# Patient Record
Sex: Female | Born: 1985 | Race: Black or African American | Hispanic: No | Marital: Single | Smoking: Current every day smoker
Health system: Southern US, Community
[De-identification: ages and names within clinical notes are randomized; demographics above are authoritative.]

## PROBLEM LIST (undated history)

## (undated) DIAGNOSIS — Z789 Other specified health status: Secondary | ICD-10-CM

## (undated) DIAGNOSIS — A749 Chlamydial infection, unspecified: Secondary | ICD-10-CM

## (undated) HISTORY — PX: NO PAST SURGERIES: SHX2092

## (undated) HISTORY — PX: OTHER SURGICAL HISTORY: SHX169

## (undated) HISTORY — PX: LIPOSUCTION: SHX10

## (undated) HISTORY — DX: Chlamydial infection, unspecified: A74.9

---
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
## 2006-01-11 DIAGNOSIS — A749 Chlamydial infection, unspecified: Secondary | ICD-10-CM

## 2006-01-11 HISTORY — DX: Chlamydial infection, unspecified: A74.9

## 2006-02-07 ENCOUNTER — Inpatient Hospital Stay (HOSPITAL_COMMUNITY): Admission: AD | Admit: 2006-02-07 | Discharge: 2006-02-07 | Payer: Self-pay | Admitting: Obstetrics & Gynecology

## 2006-04-06 ENCOUNTER — Encounter (INDEPENDENT_AMBULATORY_CARE_PROVIDER_SITE_OTHER): Payer: Self-pay | Admitting: *Deleted

## 2006-04-06 ENCOUNTER — Ambulatory Visit: Payer: Self-pay | Admitting: Obstetrics & Gynecology

## 2006-04-06 ENCOUNTER — Encounter (INDEPENDENT_AMBULATORY_CARE_PROVIDER_SITE_OTHER): Payer: Self-pay | Admitting: Obstetrics & Gynecology

## 2006-06-03 ENCOUNTER — Ambulatory Visit: Payer: Self-pay | Admitting: Family Medicine

## 2006-06-22 ENCOUNTER — Ambulatory Visit: Payer: Self-pay | Admitting: Obstetrics and Gynecology

## 2006-07-11 ENCOUNTER — Inpatient Hospital Stay (HOSPITAL_COMMUNITY): Admission: AD | Admit: 2006-07-11 | Discharge: 2006-07-12 | Payer: Self-pay | Admitting: Obstetrics & Gynecology

## 2006-07-12 ENCOUNTER — Inpatient Hospital Stay (HOSPITAL_COMMUNITY): Admission: AD | Admit: 2006-07-12 | Discharge: 2006-07-13 | Payer: Self-pay | Admitting: Family Medicine

## 2006-09-16 ENCOUNTER — Ambulatory Visit: Payer: Self-pay | Admitting: Obstetrics & Gynecology

## 2006-12-12 ENCOUNTER — Ambulatory Visit: Payer: Self-pay | Admitting: Obstetrics and Gynecology

## 2007-10-27 IMAGING — CT CT PELVIS W/ CM
2 of 5 series · 17 of 46 positions shown, 19 images · IV contrast (APPLIED)
Comparison: No prior CT studies for comparison.

CLINICAL DATA: Mid abdominal pain.  Diarrhea.  Elevated white blood cell count. 
 ABDOMEN CT WITH CONTRAST:
TECHNIQUE: Multidetector CT imaging of the abdomen was performed following the standard protocol during bolus administration of intravenous contrast.
 Contrast:  80 cc Omnipaque 300.  Oral contrast was also administered.
TECHNIQUE: Multidetector CT imaging of the pelvis was performed following the standard protocol during bolus administration of intravenous contrast.

[Series 2: abd/pelv with 5.0 b31f st · axial · 0.59mm/px · z∈[+878,+1253]mm · 14 of 85 slices shown, 16 images]
[im 5/85  soft-tissue]
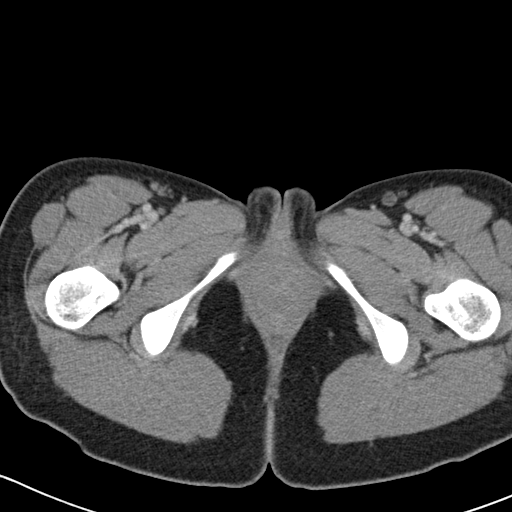
[im 5/85  bone]
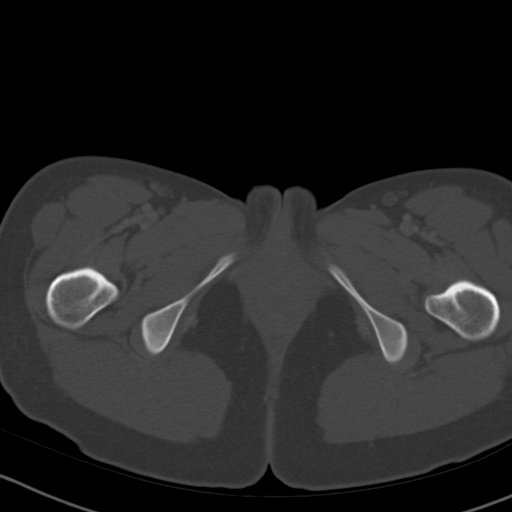
[im 13/85  soft-tissue]
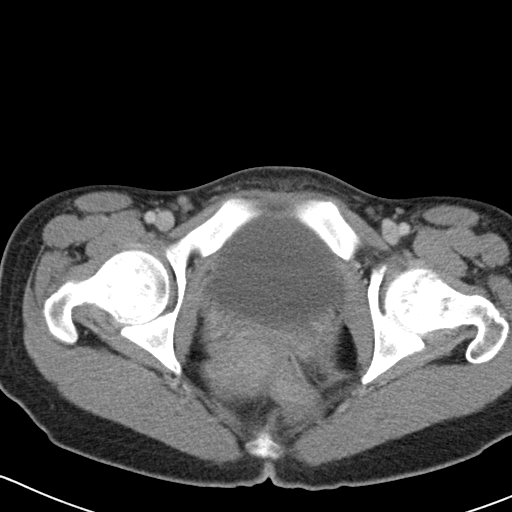
[im 17/85  soft-tissue]
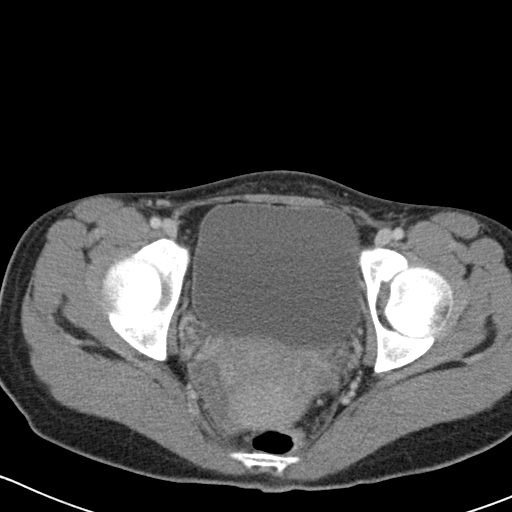
[im 22/85  soft-tissue]
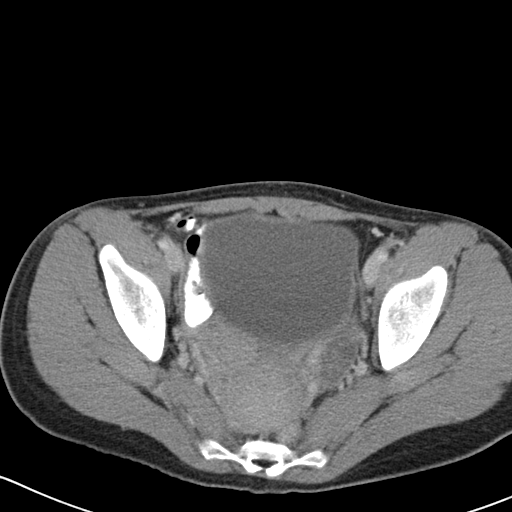
[im 30/85  soft-tissue]
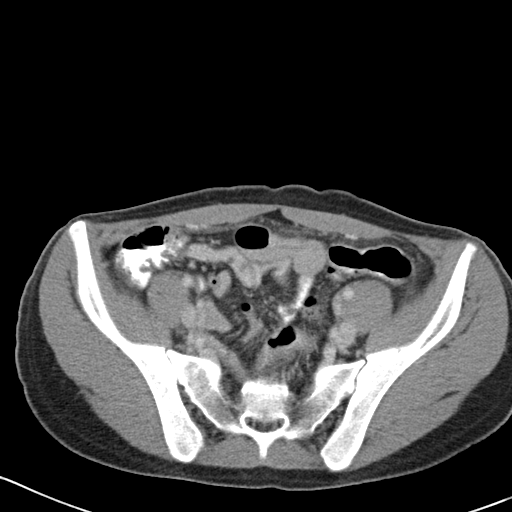
[im 34/85  soft-tissue]
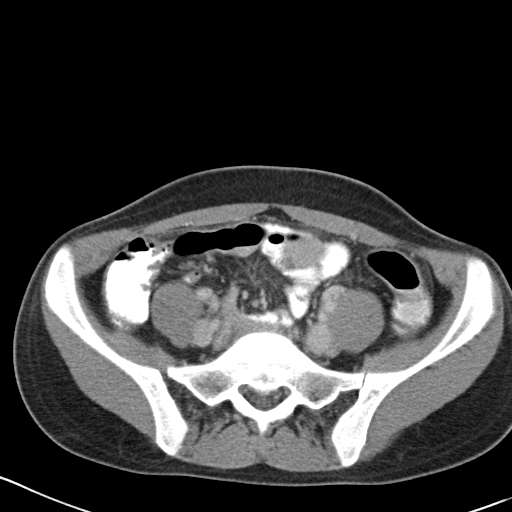
[im 38/85  soft-tissue]
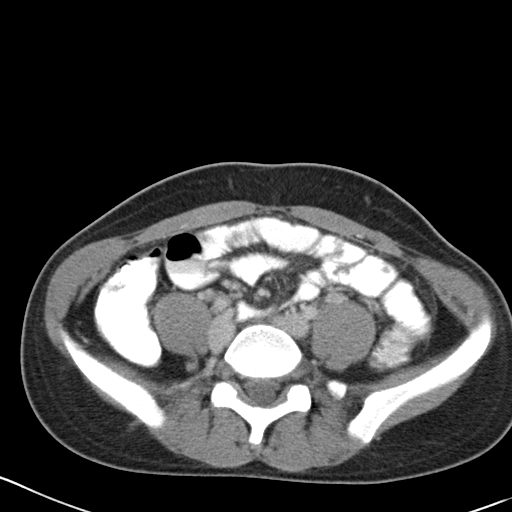
[im 47/85  soft-tissue]
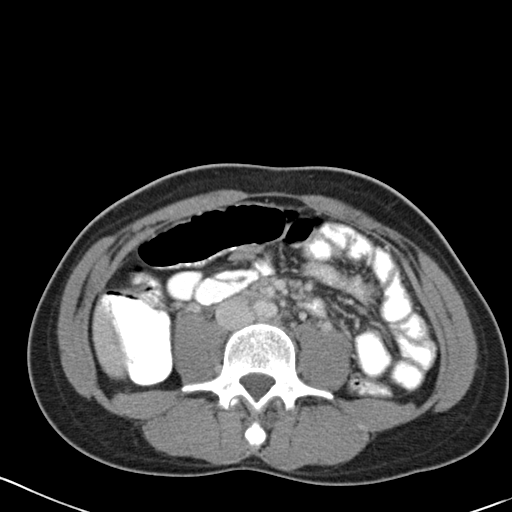
[im 51/85  soft-tissue]
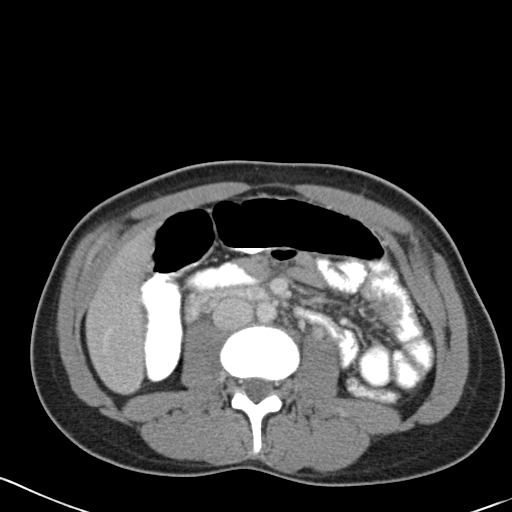
[im 51/85  bone]
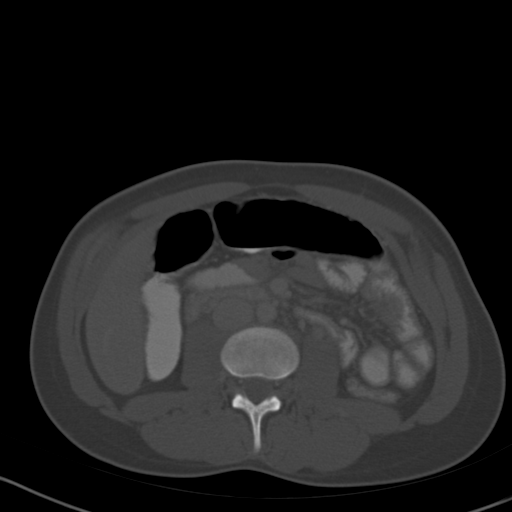
[im 55/85  soft-tissue]
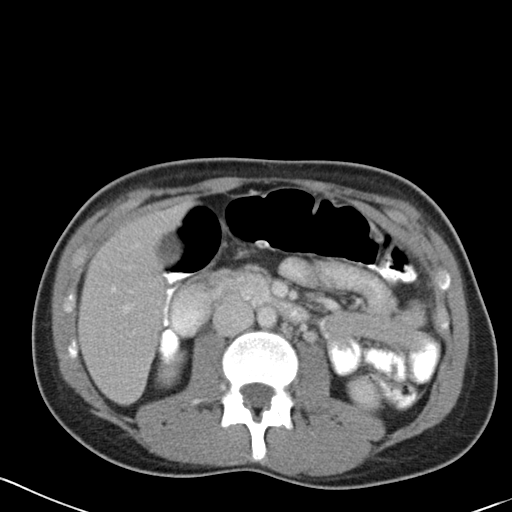
[im 64/85  soft-tissue]
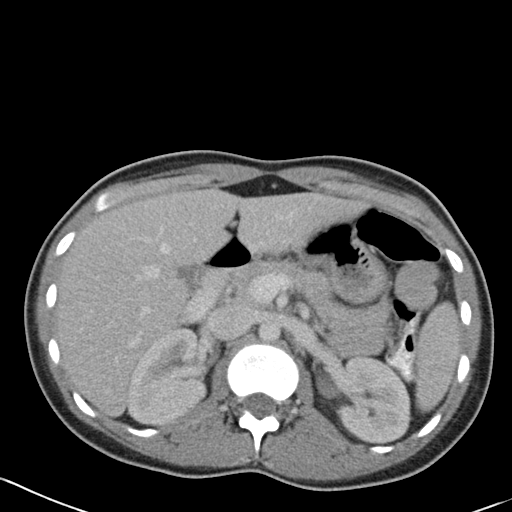
[im 68/85  soft-tissue]
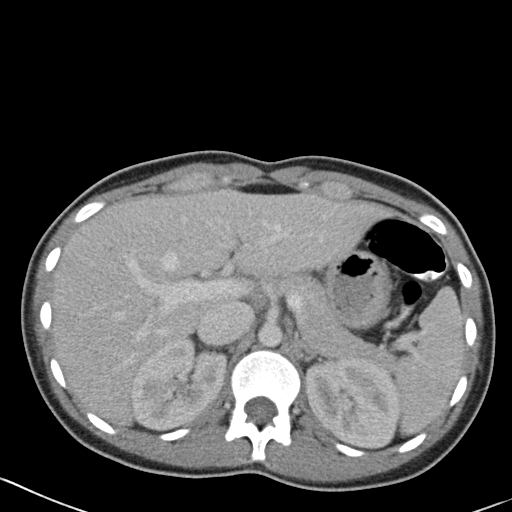
[im 72/85  soft-tissue]
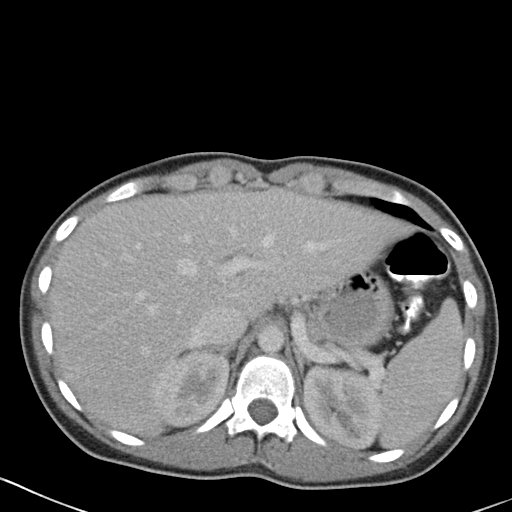
[im 80/85  soft-tissue]
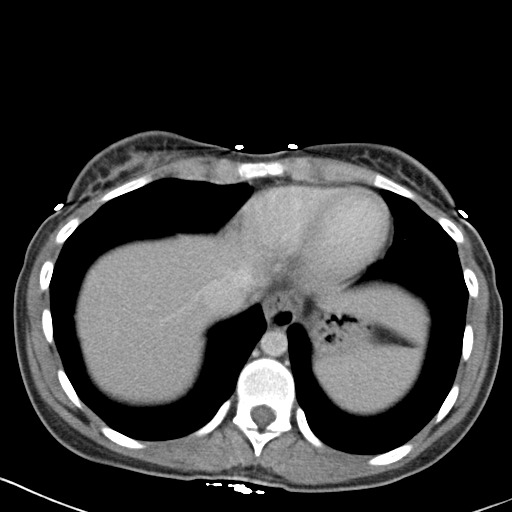

[Series 603: coronal · coronal · 0.82mm/px · 3 of 87 slices shown]
[im 29/87  soft-tissue]
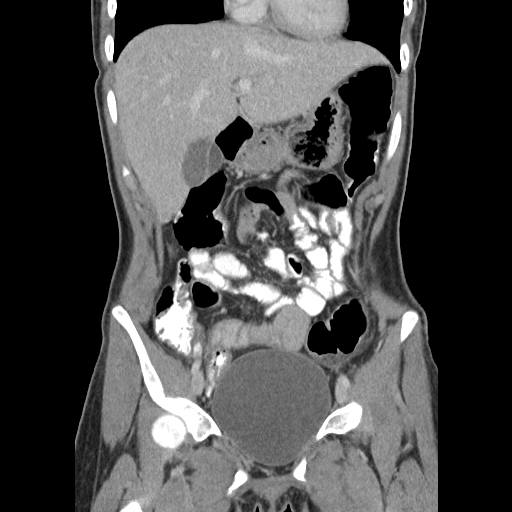
[im 39/87  soft-tissue]
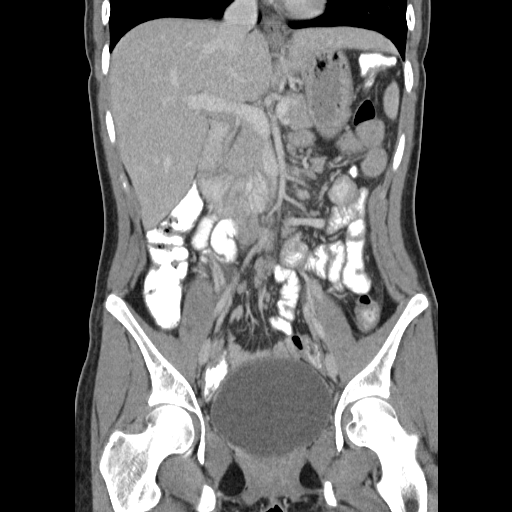
[im 48/87  soft-tissue]
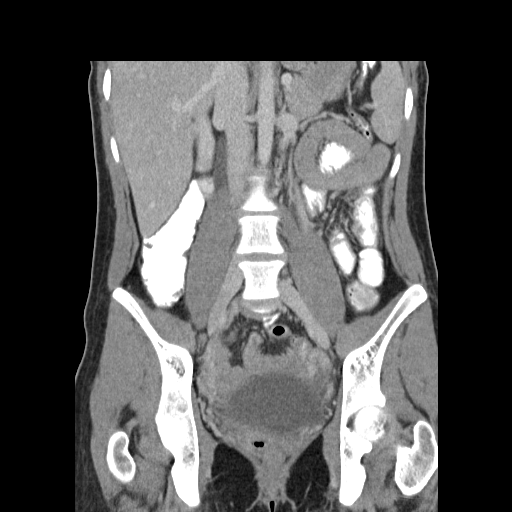

[17 of 46 positions shown; findings below may reference images not displayed]

FINDINGS: Visualized lung bases are unremarkable.  The contrast-enhanced appearance of the liver, spleen, pancreas, gallbladder, adrenal glands, and kidneys are within normal limits.  Delayed imaging demonstrates normal excretion of contrast from both kidneys in a symmetric fashion.  There is a small posterior left renal cyst present, which has a benign appearance.  Bowel loops have normal caliber without obstruction of inflammatory changes.  No free fluid or abscess is seen within the abdomen.  No evidence of adenopathy.  Bony structures are unremarkable.
IMPRESSION: Normal CT of the abdomen. 
 PELVIS CT WITH CONTRAST:
FINDINGS: The appendix is visualized in the right lower pelvis as a normal discrete structure without evidence of thickening or surrounding inflammation.  No acute findings.  The uterus and adnexal regions are grossly unremarkable by CT.  The bladder is moderately distended.  No hernias.
IMPRESSION: No acute findings in the pelvis.  Normal CT appearance of the appendix.

## 2007-11-27 ENCOUNTER — Inpatient Hospital Stay (HOSPITAL_COMMUNITY): Admission: AD | Admit: 2007-11-27 | Discharge: 2007-11-27 | Payer: Self-pay | Admitting: Family Medicine

## 2008-06-21 ENCOUNTER — Inpatient Hospital Stay (HOSPITAL_COMMUNITY): Admission: AD | Admit: 2008-06-21 | Discharge: 2008-06-23 | Payer: Self-pay | Admitting: Obstetrics

## 2009-04-07 ENCOUNTER — Inpatient Hospital Stay (HOSPITAL_COMMUNITY): Admission: AD | Admit: 2009-04-07 | Discharge: 2009-04-07 | Payer: Self-pay | Admitting: Family Medicine

## 2009-04-07 ENCOUNTER — Encounter: Payer: Self-pay | Admitting: Family Medicine

## 2009-04-07 ENCOUNTER — Ambulatory Visit: Payer: Self-pay | Admitting: Family Medicine

## 2009-04-09 ENCOUNTER — Ambulatory Visit: Payer: Self-pay | Admitting: Obstetrics and Gynecology

## 2009-04-09 LAB — CONVERTED CEMR LAB
Hgb A: 64.4 % — ABNORMAL LOW (ref 96.8–97.8)
Hgb F Quant: 0 % (ref 0.0–2.0)

## 2009-04-16 ENCOUNTER — Ambulatory Visit: Payer: Self-pay | Admitting: Obstetrics and Gynecology

## 2009-04-23 ENCOUNTER — Ambulatory Visit: Payer: Self-pay | Admitting: Obstetrics and Gynecology

## 2009-04-28 ENCOUNTER — Ambulatory Visit: Payer: Self-pay | Admitting: Obstetrics and Gynecology

## 2009-04-28 ENCOUNTER — Inpatient Hospital Stay (HOSPITAL_COMMUNITY): Admission: AD | Admit: 2009-04-28 | Discharge: 2009-04-30 | Payer: Self-pay | Admitting: Obstetrics & Gynecology

## 2009-09-03 ENCOUNTER — Inpatient Hospital Stay (HOSPITAL_COMMUNITY): Admission: AD | Admit: 2009-09-03 | Discharge: 2009-09-03 | Payer: Self-pay | Admitting: Obstetrics & Gynecology

## 2009-09-03 ENCOUNTER — Ambulatory Visit: Payer: Self-pay | Admitting: Gynecology

## 2009-09-12 ENCOUNTER — Inpatient Hospital Stay (HOSPITAL_COMMUNITY): Admission: AD | Admit: 2009-09-12 | Discharge: 2009-09-12 | Payer: Self-pay | Admitting: Obstetrics & Gynecology

## 2010-03-26 LAB — WET PREP, GENITAL: Trich, Wet Prep: NONE SEEN

## 2010-03-26 LAB — GC/CHLAMYDIA PROBE AMP, GENITAL: GC Probe Amp, Genital: NEGATIVE

## 2010-03-31 LAB — CBC
HCT: 28 % — ABNORMAL LOW (ref 36.0–46.0)
HCT: 30.1 % — ABNORMAL LOW (ref 36.0–46.0)
Hemoglobin: 9.1 g/dL — ABNORMAL LOW (ref 12.0–15.0)
Hemoglobin: 9.7 g/dL — ABNORMAL LOW (ref 12.0–15.0)
MCHC: 32.2 g/dL (ref 30.0–36.0)
MCHC: 32.5 g/dL (ref 30.0–36.0)
MCV: 72.3 fL — ABNORMAL LOW (ref 78.0–100.0)
MCV: 72.4 fL — ABNORMAL LOW (ref 78.0–100.0)
Platelets: 258 10*3/uL (ref 150–400)
Platelets: 259 10*3/uL (ref 150–400)
RBC: 3.88 MIL/uL (ref 3.87–5.11)
RBC: 4.16 MIL/uL (ref 3.87–5.11)
RDW: 16.3 % — ABNORMAL HIGH (ref 11.5–15.5)
RDW: 16.4 % — ABNORMAL HIGH (ref 11.5–15.5)
WBC: 9.3 10*3/uL (ref 4.0–10.5)
WBC: 9.6 10*3/uL (ref 4.0–10.5)

## 2010-03-31 LAB — T.PALLIDUM AB, IGG: T pallidum Antibodies (TP-PA): 0.07 S/CO (ref <0.90)

## 2010-03-31 LAB — RPR: RPR Ser Ql: REACTIVE — AB

## 2010-04-01 LAB — POCT URINALYSIS DIP (DEVICE)
Bilirubin Urine: NEGATIVE
Ketones, ur: NEGATIVE mg/dL
Leukocytes, UA: NEGATIVE
Nitrite: NEGATIVE
Protein, ur: NEGATIVE mg/dL
pH: 6 (ref 5.0–8.0)
pH: 6.5 (ref 5.0–8.0)

## 2010-04-05 LAB — TYPE AND SCREEN: ABO/RH(D): B POS

## 2010-04-05 LAB — RUBELLA SCREEN: Rubella: 83.8 IU/mL — ABNORMAL HIGH

## 2010-04-05 LAB — POCT URINALYSIS DIP (DEVICE)
Bilirubin Urine: NEGATIVE
Hgb urine dipstick: NEGATIVE
Ketones, ur: NEGATIVE mg/dL
Nitrite: NEGATIVE
Urobilinogen, UA: 0.2 mg/dL (ref 0.0–1.0)
pH: 7 (ref 5.0–8.0)

## 2010-04-05 LAB — URINALYSIS, ROUTINE W REFLEX MICROSCOPIC
Glucose, UA: NEGATIVE mg/dL
Protein, ur: NEGATIVE mg/dL
Specific Gravity, Urine: 1.01 (ref 1.005–1.030)
Urobilinogen, UA: 0.2 mg/dL (ref 0.0–1.0)
pH: 6.5 (ref 5.0–8.0)

## 2010-04-05 LAB — CBC
HCT: 27 % — ABNORMAL LOW (ref 36.0–46.0)
Hemoglobin: 8.7 g/dL — ABNORMAL LOW (ref 12.0–15.0)
MCV: 74.6 fL — ABNORMAL LOW (ref 78.0–100.0)
Platelets: 272 10*3/uL (ref 150–400)
RBC: 3.62 MIL/uL — ABNORMAL LOW (ref 3.87–5.11)
RDW: 15.6 % — ABNORMAL HIGH (ref 11.5–15.5)

## 2010-04-05 LAB — DIFFERENTIAL
Basophils Relative: 0 % (ref 0–1)
Eosinophils Relative: 0 % (ref 0–5)
Neutro Abs: 6.7 10*3/uL (ref 1.7–7.7)

## 2010-04-05 LAB — RAPID URINE DRUG SCREEN, HOSP PERFORMED: Amphetamines: NOT DETECTED

## 2010-04-05 LAB — WET PREP, GENITAL
Clue Cells Wet Prep HPF POC: NONE SEEN
Yeast Wet Prep HPF POC: NONE SEEN

## 2010-04-05 LAB — HEPATITIS B SURFACE ANTIGEN: Hepatitis B Surface Ag: NEGATIVE

## 2010-04-05 LAB — URINE CULTURE: Colony Count: NO GROWTH

## 2010-04-05 LAB — RPR: RPR Ser Ql: NONREACTIVE

## 2010-04-05 LAB — STREP B DNA PROBE

## 2010-04-05 LAB — RAPID HIV SCREEN (WH-MAU): Rapid HIV Screen: NONREACTIVE

## 2010-04-20 LAB — CBC
HCT: 24.7 % — ABNORMAL LOW (ref 36.0–46.0)
Hemoglobin: 10.5 g/dL — ABNORMAL LOW (ref 12.0–15.0)
Hemoglobin: 8.3 g/dL — ABNORMAL LOW (ref 12.0–15.0)
MCHC: 33.8 g/dL (ref 30.0–36.0)
MCV: 78.7 fL (ref 78.0–100.0)
RBC: 3.94 MIL/uL (ref 3.87–5.11)
WBC: 11.3 10*3/uL — ABNORMAL HIGH (ref 4.0–10.5)
WBC: 11.8 10*3/uL — ABNORMAL HIGH (ref 4.0–10.5)

## 2010-04-20 LAB — RPR: RPR Ser Ql: NONREACTIVE

## 2010-05-26 NOTE — H&P (Signed)
Amy [name], Amy [name]             ACCOUNT NO.:  [id number]   MEDICAL RECORD NO.:  [id number]          PATIENT TYPE:  INP   LOCATION:  9133                          FACILITY:  [facility]   PHYSICIAN:  Roseanna Rainbow, M.D.DATE OF BIRTH:  Jun 19, 1985   DATE OF ADMISSION:  06/21/2008  DATE OF DISCHARGE:                              HISTORY & PHYSICAL   CHIEF COMPLAINT:  The patient is a 25 year old para 3 at 40 plus 1 week,  complaining of uterine contractions.   HISTORY OF PRESENT ILLNESS:  Please see the above.   SOCIAL HISTORY:  She is single.  She denies any tobacco, ethanol, or  drug use.   ALLERGIES:  No known drug allergies.   PAST GYNECOLOGIC HISTORY:  History of Chlamydia.  Normal triad.   PAST OBSTETRICAL HISTORY:  In 2004, she was delivered of a live born  female, 6 pounds 8 ounces, full term vaginal delivery, no complications.  In 2007, she was delivered a live born female, 7 pounds 3 ounces, full  term, spontaneous vaginal delivery, no complications.   PAST MEDICAL HISTORY:  Urinary tract infection and pyelonephritis.   PAST SURGICAL HISTORY:  She denies.   FAMILY HISTORY:  Noncontributory.   PRENATAL LABS:  Hemoglobin 10.6, hematocrit 32.3, and platelets 304,000.  Blood type B positive, antibody screen negative, RPR nonreactive,  rubella immune, hepatitis C surface antigen negative, HIV nonreactive.  PPD negative.  Pap test negative.  GC probe negative.  Chlamydia probe  negative.  One-hour GTT 72.  GBS negative on May 28, 2008.   PHYSICAL EXAMINATION:  VITAL SIGNS:  Stable, afebrile.  Fetal heart  tracing reassuring.  Tocodynamometer, uterine contractions every 2-4  minutes.  ABDOMEN:  Gravid.  Sterile vaginal exam per the RN.  The cervix is 4 cm  dilated.   ASSESSMENT:  Multiparous patient at term, late latent versus early  active labor.  Fetal heart tracing consistent with fetal well-being.   PLAN:  Admission.  Expectant management.      Roseanna Rainbow, M.D.  Electronically Signed     LAJ/MEDQ  D:  06/21/2008  T:  06/21/2008  Job:  [id number]

## 2010-05-29 NOTE — Discharge Summary (Signed)
   NAME:  Amy [name], Amy [name]                       ACCOUNT NO.:  [id number]   MEDICAL RECORD NO.:  [id number]                   PATIENT TYPE:  INP   LOCATION:  9102                                 FACILITY:  [facility]   PHYSICIAN:  Georgina Peer, M.D.              DATE OF BIRTH:  1985-06-07   DATE OF ADMISSION:  01/06/2002  DATE OF DISCHARGE:  01/08/2002                                 DISCHARGE SUMMARY   ADMISSION DIAGNOSES:  1. Abdominal and flank pain.  2. Pregnancy 18 weeks.  3. Suspect pyelonephritis.  4. Positive Chlamydia untreated.   DISCHARGE DIAGNOSES:  1. Urinary tract infection responding to antibiotics.  2. Chlamydia, treated.  3. Abdominal pain, resolving.   HISTORY OF PRESENT ILLNESS:  The patient is a 25 year old gravida 1, para 0  with an EDC of Jun 11, 2002, a known E. coli UTI with medication called into  the pharmacy December 15, 2001, but not picked up for Macrodantin and also  Zithromax 1 g for Chlamydia.  The patient presented with lower abdominal and  right flank pain.  The result of the urine culture and sensitivities and  Chlamydia was unknown to the covering physician.  She also had a positive  antibody screen, anti-V with a titer of 1:1 on December 13, 2001.  The  patient is feeling better.  She was admitted and was started on IV  ampicillin.  The cultures from the office showed E. coli that was resistant  to ampicillin.  She was then switched to Macrobid.  She was afebrile.  WBC  count was 9.5, hemoglobin 9.9, platelet count 379,000, CMET normal.  IV  fluids rehydrated the patient.  Abdominal ultrasound revealed normal  abdomen, no kidney problems, a hypoechoic soft tissue density along the  anterior uterine wall measuring 8 x 3 x 2 consistent with an old  subchorionic hemorrhage.  Age compatible with 18 weeks and 1 day.  Placenta  was posterior.  Cervix was 4.4 and closed.  Female gender was noted.  With  the patient on Macrobid doing better, no  nausea/vomiting, able to take oral  medicine, the ultrasound results were discussed and the patient was treated  with 1 g of Zithromax during the hospital stay.  She was discharged home in  good condition to follow up with Macrobid 100 mg b.i.d. for seven days,  follow up in the office in two weeks.  Urine culture in four weeks.  Chlamydia test of cure in two weeks.                                               Georgina Peer, M.D.    JPN/MEDQ  D:  01/08/2002  T:  01/08/2002  Job:  [id number]

## 2010-05-29 NOTE — Group Therapy Note (Signed)
NAMEMI, BALLA NO.:  [id number]   MEDICAL RECORD NO.:  [id number]          PATIENT TYPE:  WOC   LOCATION:  [facility]                   FACILITY:  [facility]   PHYSICIAN:  Dorthula Perfect, MD     DATE OF BIRTH:  22-Dec-1985   DATE OF SERVICE:  04/06/2006                                  CLINIC NOTE   A 25 year old black female, gravida 2 para 2, last menstrual period  March 6 is here for annual exam, Pap smear, and Depo shot.  She has only  received the Depo Provera once and that was early last year at her  postpartum visit.  Since that time condoms have been used.  She has not  had intercourse in the past 3 weeks.  She complains also of a vaginal  odor and slight discharge.  She douches once a month with Summer's Eve.   PAST MEDICAL HISTORY:  No surgery or allergies.   FAMILY HISTORY:  Negative.   PHYSICAL EXAMINATION:  Height 5 foot 5, weight 126, blood pressure  120/70, thyroid exam is normal.  BREASTS:  Bilaterally normal.  ABDOMEN:  Flat, soft, and nontender.  No masses are felt.  PELVIC:  External genitalia and BUS glands are normal.  Vaginal vault  was epithelized.  There was a white, watery vaginal discharge present.  Cervix was epithelized.  BIMANUAL EXAMINATION:  The uterus is in the midline and is of normal  size and shape.  Adnexa and structures are normal.   Wet prep was performed.  It did show presence of clue cells compatible  with bacterial vaginosis.   DIAGNOSIS:  1. Bacterial vaginosis.  2. Contraception - Depo Provera.   DISPOSITION:  1. Wet prep.  2. Thin Prep Pap smear with, at the patient's request, probe for GC      and chlamydia.  3. Depo Provera 150 mg every 3 months.           ______________________________  Dorthula Perfect, MD     ER/MEDQ  D:  04/06/2006  T:  04/06/2006  Job:  [id number]

## 2010-08-04 ENCOUNTER — Encounter (HOSPITAL_COMMUNITY): Payer: Self-pay | Admitting: *Deleted

## 2010-08-04 ENCOUNTER — Inpatient Hospital Stay (HOSPITAL_COMMUNITY)
Admission: AD | Admit: 2010-08-04 | Discharge: 2010-08-04 | Disposition: A | Discharge status: Home / Self Care | Payer: Self-pay | Source: Ambulatory Visit | Attending: Obstetrics & Gynecology | Admitting: Obstetrics & Gynecology

## 2010-08-04 DIAGNOSIS — M545 Low back pain, unspecified: Secondary | ICD-10-CM | POA: Insufficient documentation

## 2010-08-04 DIAGNOSIS — R110 Nausea: Secondary | ICD-10-CM | POA: Insufficient documentation

## 2010-08-04 DIAGNOSIS — F17200 Nicotine dependence, unspecified, uncomplicated: Secondary | ICD-10-CM | POA: Insufficient documentation

## 2010-08-04 DIAGNOSIS — R350 Frequency of micturition: Secondary | ICD-10-CM | POA: Insufficient documentation

## 2010-08-04 DIAGNOSIS — N390 Urinary tract infection, site not specified: Secondary | ICD-10-CM | POA: Insufficient documentation

## 2010-08-04 DIAGNOSIS — R300 Dysuria: Secondary | ICD-10-CM | POA: Diagnosis present

## 2010-08-04 LAB — URINALYSIS, ROUTINE W REFLEX MICROSCOPIC
Bilirubin Urine: NEGATIVE
Glucose, UA: >1000 mg/dL — AB
Ketones, ur: NEGATIVE mg/dL
Specific Gravity, Urine: 1.015 (ref 1.005–1.030)
pH: 6 (ref 5.0–8.0)

## 2010-08-04 LAB — URINE MICROSCOPIC-ADD ON

## 2010-08-04 LAB — POCT PREGNANCY, URINE: Preg Test, Ur: NEGATIVE

## 2010-08-04 MED ORDER — SULFAMETHOXAZOLE-TRIMETHOPRIM 800-160 MG PO TABS: 1.0000 | ORAL_TABLET | Freq: Two times a day (BID) | ORAL | Status: AC

## 2010-08-04 MED ORDER — ONDANSETRON HCL 4 MG PO TABS: 4.0000 mg | ORAL_TABLET | Freq: Three times a day (TID) | ORAL | Status: AC | PRN

## 2010-08-04 MED ORDER — PHENAZOPYRIDINE HCL 200 MG PO TABS: 200.0000 mg | ORAL_TABLET | Freq: Three times a day (TID) | ORAL | Status: AC | PRN

## 2010-08-04 NOTE — Progress Notes (Signed)
Pt states she has been having pressure in both sides for the past week with headache.Pt states she has also been nauseous and is having dizzy spells as well

## 2010-08-04 NOTE — ED Provider Notes (Signed)
History   Pt reports dysuria, frequency, bilat LBP x 1 week and new-onset nausea. She denies flank pain, fever, chills.  Chief Complaint  Patient presents with  . Abdominal Pain    pain present x 1 week   HPI  Past Medical History  Diagnosis Date  . No pertinent past medical history     Past Surgical History  Procedure Date  . No past surgeries     No family history on file.  History  Substance Use Topics  . Smoking status: Current Everyday Smoker -- 0.5 packs/day  . Smokeless tobacco: Never Used  . Alcohol Use: No    OB History    Grav Para Term Preterm Abortions TAB SAB Ect Mult Living   13 8 4 4 5 1 4  0 0 1      Review of Systems  Physical Exam  BP 109/69  Pulse 99  Temp(Src) 99.4 F (37.4 C) (Oral)  Resp 20  Ht 6\' 6"  (1.981 m)  Wt 67.246 kg (148 lb 4 oz)  BMI 17.13 kg/m2  LMP 07/13/2010  Physical Exam No CVAT Results for orders placed during the hospital encounter of 08/04/10 (from the past 24 hour(s))  URINALYSIS, ROUTINE W REFLEX MICROSCOPIC     Status: Abnormal   Collection Time   08/04/10  8:25 PM      Component Value Range   Color, Urine YELLOW  YELLOW    Appearance CLEAR  CLEAR    Specific Gravity, Urine 1.015  1.005 - 1.030    pH 6.0  5.0 - 8.0    Glucose, UA >1000 (*) NEGATIVE (mg/dL)   Hgb urine dipstick TRACE (*) NEGATIVE    Bilirubin Urine NEGATIVE  NEGATIVE    Ketones, ur NEGATIVE  NEGATIVE (mg/dL)   Protein, ur NEGATIVE  NEGATIVE (mg/dL)   Urobilinogen, UA 0.2  0.0 - 1.0 (mg/dL)   Nitrite POSITIVE (*) NEGATIVE    Leukocytes, UA MODERATE (*) NEGATIVE   URINE MICROSCOPIC-ADD ON     Status: Abnormal   Collection Time   08/04/10  8:25 PM      Component Value Range   Squamous Epithelial / LPF FEW (*) RARE    WBC, UA TOO NUMEROUS TO COUNT  <3 (WBC/hpf)   Bacteria, UA FEW (*) RARE   POCT PREGNANCY, URINE     Status: Normal   Collection Time   08/04/10  9:18 PM      Component Value Range   Preg Test, Ur NEGATIVE       ED Course    Procedures Assessment: 1. UTI  Plan 1. D/C home 2. Rx Bactrim DS PO BID x 3 days 3. Rx; Pyridium and Zofran 4. Push PO fluids

## 2010-08-06 LAB — URINE CULTURE: Culture  Setup Time: 201207250405

## 2010-08-11 ENCOUNTER — Inpatient Hospital Stay (INDEPENDENT_AMBULATORY_CARE_PROVIDER_SITE_OTHER)
Admission: RE | Admit: 2010-08-11 | Discharge: 2010-08-11 | Disposition: A | Discharge status: Home / Self Care | Payer: Self-pay | Source: Ambulatory Visit | Attending: Emergency Medicine | Admitting: Emergency Medicine

## 2010-08-11 DIAGNOSIS — N390 Urinary tract infection, site not specified: Secondary | ICD-10-CM

## 2010-08-11 DIAGNOSIS — B373 Candidiasis of vulva and vagina: Secondary | ICD-10-CM

## 2010-08-11 DIAGNOSIS — Z331 Pregnant state, incidental: Secondary | ICD-10-CM

## 2010-08-11 LAB — WET PREP, GENITAL
Clue Cells Wet Prep HPF POC: NONE SEEN
Trich, Wet Prep: NONE SEEN

## 2010-08-11 LAB — POCT URINALYSIS DIP (DEVICE)
Ketones, ur: 15 mg/dL — AB
Leukocytes, UA: NEGATIVE
Specific Gravity, Urine: 1.02 (ref 1.005–1.030)
pH: 7 (ref 5.0–8.0)

## 2010-08-12 LAB — GC/CHLAMYDIA PROBE AMP, GENITAL: Chlamydia, DNA Probe: NEGATIVE

## 2010-10-13 LAB — WET PREP, GENITAL
Trich, Wet Prep: NONE SEEN
Yeast Wet Prep HPF POC: NONE SEEN

## 2010-10-19 LAB — POCT URINALYSIS DIP (DEVICE)
Glucose, UA: NEGATIVE
Ketones, ur: NEGATIVE
Operator id: 194561
Protein, ur: NEGATIVE
Urobilinogen, UA: 0.2

## 2010-10-27 LAB — URINALYSIS, ROUTINE W REFLEX MICROSCOPIC
Ketones, ur: NEGATIVE
Nitrite: NEGATIVE
Protein, ur: NEGATIVE

## 2011-02-12 ENCOUNTER — Emergency Department (HOSPITAL_COMMUNITY): Admission: EM | Admit: 2011-02-12 | Discharge: 2011-02-12 | Discharge status: Against Medical Advice | Payer: Self-pay

## 2011-06-03 ENCOUNTER — Encounter: Payer: Self-pay | Admitting: Family Medicine

## 2011-06-03 ENCOUNTER — Ambulatory Visit (INDEPENDENT_AMBULATORY_CARE_PROVIDER_SITE_OTHER): Payer: Medicaid Other | Admitting: Family Medicine

## 2011-06-03 ENCOUNTER — Other Ambulatory Visit (HOSPITAL_COMMUNITY)
Admission: RE | Admit: 2011-06-03 | Discharge: 2011-06-03 | Disposition: A | Discharge status: Home / Self Care | Payer: Medicaid Other | Source: Ambulatory Visit | Attending: Family Medicine | Admitting: Family Medicine

## 2011-06-03 VITALS — BP 116/71 | HR 59 | Temp 98.4°F | Ht 66.5 in | Wt 159.0 lb

## 2011-06-03 DIAGNOSIS — Z3201 Encounter for pregnancy test, result positive: Secondary | ICD-10-CM

## 2011-06-03 DIAGNOSIS — F17200 Nicotine dependence, unspecified, uncomplicated: Secondary | ICD-10-CM | POA: Insufficient documentation

## 2011-06-03 DIAGNOSIS — Z113 Encounter for screening for infections with a predominantly sexual mode of transmission: Secondary | ICD-10-CM | POA: Insufficient documentation

## 2011-06-03 DIAGNOSIS — IMO0001 Reserved for inherently not codable concepts without codable children: Secondary | ICD-10-CM

## 2011-06-03 DIAGNOSIS — N912 Amenorrhea, unspecified: Secondary | ICD-10-CM

## 2011-06-03 DIAGNOSIS — N898 Other specified noninflammatory disorders of vagina: Secondary | ICD-10-CM | POA: Insufficient documentation

## 2011-06-03 DIAGNOSIS — Z309 Encounter for contraceptive management, unspecified: Secondary | ICD-10-CM

## 2011-06-03 DIAGNOSIS — Z3009 Encounter for other general counseling and advice on contraception: Secondary | ICD-10-CM | POA: Insufficient documentation

## 2011-06-03 DIAGNOSIS — R635 Abnormal weight gain: Secondary | ICD-10-CM | POA: Insufficient documentation

## 2011-06-03 LAB — POCT WET PREP (WET MOUNT)

## 2011-06-03 LAB — COMPREHENSIVE METABOLIC PANEL
ALT: 14 U/L (ref 0–35)
BUN: 8 mg/dL (ref 6–23)
CO2: 24 mEq/L (ref 19–32)
Calcium: 9 mg/dL (ref 8.4–10.5)
Chloride: 106 mEq/L (ref 96–112)
Creat: 0.66 mg/dL (ref 0.50–1.10)

## 2011-06-03 LAB — LIPID PANEL
Cholesterol: 154 mg/dL (ref 0–200)
Triglycerides: 67 mg/dL (ref <150)

## 2011-06-03 MED ORDER — METRONIDAZOLE 500 MG PO TABS: 500.0000 mg | ORAL_TABLET | Freq: Two times a day (BID) | ORAL | Status: AC

## 2011-06-03 NOTE — Assessment & Plan Note (Signed)
Currently pregnant. Desires termination. Desire tubal ligation. Gyn referral placed for consultation.

## 2011-06-03 NOTE — Assessment & Plan Note (Signed)
Wet prep positive for clue cells. Will treat for BV with flagyl 500 mg BID x7 days. F/u GC/Chlam.

## 2011-06-03 NOTE — Assessment & Plan Note (Signed)
Weight gain. Non obese. Check lipids and fasting CMET.

## 2011-06-03 NOTE — Assessment & Plan Note (Signed)
Patient desires termination of pregnancy and subsequent permament sterilization. She knows a reputable clinic for termination.

## 2011-06-03 NOTE — Assessment & Plan Note (Signed)
Advised cessation. Patient is contemplative. Will f/u.

## 2011-06-03 NOTE — Progress Notes (Signed)
Patient ID: Amy [name], female   DOB: 01-31-1985, 26 y.o.   MRN: [id number] Subjective:     Amy [name] is a 26 y.o. female new patient here to establish primary care and for a comprehensive physical exam.   The patient reports: 1. positive home urine pregnancy test 3 days ago: LMP 05/01/11. Reports amenorrhea, increased thirst and hunger and increased urinary frequency. Sexually active with one partner. Uses condoms sometimes.   2. Vaginal discharge: x 3 months. No itching, swelling, irritation or lesions.   Past Medical History  Diagnosis Date  . Chlamydia 2008   OB History    Grav Para Term Preterm Abortions TAB SAB Ect Mult Living   8 4 4  0 3 3 0 0 0 4     History   Social History  . Marital Status: Single    Spouse Name: N/A    Number of Children: N/A  . Years of Education: 10   Occupational History  . [employer]   Social History Main Topics  . Smoking status: Current Everyday Smoker -- 0.2 packs/day for 2 years    Types: Cigarettes  . Smokeless tobacco: Never Used   Comment: 2 cigs a day  . Alcohol Use: No  . Drug Use: No  . Sexually Active: Yes   Other Topics Concern  . Not on file   Social History Narrative  . No narrative on file   Health Maintenance  Topic Date Due  . Tetanus/tdap  09/04/2005  . Influenza Vaccine  10/12/2011  . Pap Smear  04/09/2012   Review of Systems A comprehensive review of systems was negative except as noted in HPI.   Objective:    BP 116/71  Pulse 59  Temp(Src) 98.4 F (36.9 C) (Oral)  Ht 5' 6.5" (1.689 m)  Wt 159 lb (72.122 kg)  BMI 25.28 kg/m2  LMP 05/04/2011  Breastfeeding? Unknown General appearance: alert, cooperative and no distress Eyes: conjunctivae/corneas clear. PERRL, EOM's intact. Fundi benign. Neck: no adenopathy, no carotid bruit, no JVD, supple, symmetrical, trachea midline and thyroid not enlarged, symmetric, no tenderness/mass/nodules Lungs: clear to auscultation bilaterally Heart: regular  rate and rhythm, S1, S2 normal, no murmur, click, rub or gallop Abdomen: soft, non-tender; bowel sounds normal; no masses,  no organomegaly Pelvic: cervix normal in appearance, external genitalia normal, no adnexal masses or tenderness, no cervical motion tenderness, rectovaginal septum normal, uterus normal size, shape, and consistency and vagina with scant white discharge Extremities: extremities normal, atraumatic, no cyanosis or edema Skin: Skin color, texture, turgor normal. No rashes or lesions Neurologic: Grossly normal    Assessment and Plan    Healthy female exam. See problem list for assessment and plan.

## 2011-06-03 NOTE — Patient Instructions (Signed)
Amy [name],  Thank you for coming in to see me today. It was very nice to meet you.   Based on your last period you are 4 weeks and 6 days pregnant today.  If you have decided to terminate the pregnancy please do so early. If you change your mind about terminating, please call the clinic for prenatal labs and start taking prenatal vitamins.   I have placed a referral to Gyn, you will be contacted about the appointment details.   I will call or send a letter with your lab and culture results.   Do your best to decrease and quit smoking it is the best thing for your health and your family's.   Dr. Armen Pickup

## 2011-06-09 ENCOUNTER — Telehealth: Payer: Self-pay | Admitting: Family Medicine

## 2011-06-09 ENCOUNTER — Encounter: Payer: Self-pay | Admitting: Family Medicine

## 2011-06-09 NOTE — Telephone Encounter (Signed)
Attempted to call patient at home regarding her normal labs and negative GC/Chlam. I did not leave a VM as I did not recognize the name on the outgoing message. I will send a letter instead.

## 2011-06-10 ENCOUNTER — Encounter: Payer: Self-pay | Admitting: Obstetrics & Gynecology

## 2011-07-12 ENCOUNTER — Ambulatory Visit (INDEPENDENT_AMBULATORY_CARE_PROVIDER_SITE_OTHER): Payer: Medicaid Other | Admitting: Obstetrics & Gynecology

## 2011-07-12 ENCOUNTER — Encounter: Payer: Self-pay | Admitting: Obstetrics & Gynecology

## 2011-07-12 VITALS — BP 116/64 | HR 85 | Temp 98.0°F | Resp 12 | Ht 67.0 in | Wt 164.7 lb

## 2011-07-12 DIAGNOSIS — Z3490 Encounter for supervision of normal pregnancy, unspecified, unspecified trimester: Secondary | ICD-10-CM

## 2011-07-12 DIAGNOSIS — Z01812 Encounter for preprocedural laboratory examination: Secondary | ICD-10-CM

## 2011-07-12 DIAGNOSIS — Z331 Pregnant state, incidental: Secondary | ICD-10-CM

## 2011-07-12 DIAGNOSIS — Z309 Encounter for contraceptive management, unspecified: Secondary | ICD-10-CM

## 2011-07-12 NOTE — Patient Instructions (Signed)
Contraception Choices Contraception (birth control) is the use of any methods or devices to prevent pregnancy. Below are some methods to help avoid pregnancy. HORMONAL METHODS   Contraceptive implant. This is a thin, plastic tube containing progesterone hormone. It does not contain estrogen hormone. Your caregiver inserts the tube in the inner part of the upper arm. The tube can remain in place for up to 3 years. After 3 years, the implant must be removed. The implant prevents the ovaries from releasing an egg (ovulation), thickens the cervical mucus which prevents sperm from entering the uterus, and thins the lining of the inside of the uterus.   Progesterone-only injections. These injections are given every 3 months by your caregiver to prevent pregnancy. This synthetic progesterone hormone stops the ovaries from releasing eggs. It also thickens cervical mucus and changes the uterine lining. This makes it harder for sperm to survive in the uterus.   Birth control pills. These pills contain estrogen and progesterone hormone. They work by stopping the egg from forming in the ovary (ovulation). Birth control pills are prescribed by a caregiver.Birth control pills can also be used to treat heavy periods.   Minipill. This type of birth control pill contains only the progesterone hormone. They are taken every day of each month and must be prescribed by your caregiver.   Birth control patch. The patch contains hormones similar to those in birth control pills. It must be changed once a week and is prescribed by a caregiver.   Vaginal ring. The ring contains hormones similar to those in birth control pills. It is left in the vagina for 3 weeks, removed for 1 week, and then a new one is put back in place. The patient must be comfortable inserting and removing the ring from the vagina.A caregiver's prescription is necessary.   Emergency contraception. Emergency contraceptives prevent pregnancy after  unprotected sexual intercourse. This pill can be taken right after sex or up to 5 days after unprotected sex. It is most effective the sooner you take the pills after having sexual intercourse. Emergency contraceptive pills are available without a prescription. Check with your pharmacist. Do not use emergency contraception as your only form of birth control.  BARRIER METHODS   Female condom. This is a thin sheath (latex or rubber) that is worn over the penis during sexual intercourse. It can be used with spermicide to increase effectiveness.   Female condom. This is a soft, loose-fitting sheath that is put into the vagina before sexual intercourse.   Diaphragm. This is a soft, latex, dome-shaped barrier that must be fitted by a caregiver. It is inserted into the vagina, along with a spermicidal jelly. It is inserted before intercourse. The diaphragm should be left in the vagina for 6 to 8 hours after intercourse.   Cervical cap. This is a round, soft, latex or plastic cup that fits over the cervix and must be fitted by a caregiver. The cap can be left in place for up to 48 hours after intercourse.   Sponge. This is a soft, circular piece of polyurethane foam. The sponge has spermicide in it. It is inserted into the vagina after wetting it and before sexual intercourse.   Spermicides. These are chemicals that kill or block sperm from entering the cervix and uterus. They come in the form of creams, jellies, suppositories, foam, or tablets. They do not require a prescription. They are inserted into the vagina with an applicator before having sexual intercourse. The process must be   repeated every time you have sexual intercourse.  INTRAUTERINE CONTRACEPTION  Intrauterine device (IUD). This is a T-shaped device that is put in a woman's uterus during a menstrual period to prevent pregnancy. There are 2 types:   Copper IUD. This type of IUD is wrapped in copper wire and is placed inside the uterus. Copper  makes the uterus and fallopian tubes produce a fluid that kills sperm. It can stay in place for 10 years.   Hormone IUD. This type of IUD contains the hormone progestin (synthetic progesterone). The hormone thickens the cervical mucus and prevents sperm from entering the uterus, and it also thins the uterine lining to prevent implantation of a fertilized egg. The hormone can weaken or kill the sperm that get into the uterus. It can stay in place for 5 years.  PERMANENT METHODS OF CONTRACEPTION  Female tubal ligation. This is when the woman's fallopian tubes are surgically sealed, tied, or blocked to prevent the egg from traveling to the uterus.   Female sterilization. This is when the female has the tubes that carry sperm tied off (vasectomy).This blocks sperm from entering the vagina during sexual intercourse. After the procedure, the man can still ejaculate fluid (semen).  NATURAL PLANNING METHODS  Natural family planning. This is not having sexual intercourse or using a barrier method (condom, diaphragm, cervical cap) on days the woman could become pregnant.   Calendar method. This is keeping track of the length of each menstrual cycle and identifying when you are fertile.   Ovulation method. This is avoiding sexual intercourse during ovulation.   Symptothermal method. This is avoiding sexual intercourse during ovulation, using a thermometer and ovulation symptoms.   Post-ovulation method. This is timing sexual intercourse after you have ovulated.  Regardless of which type or method of contraception you choose, it is important that you use condoms to protect against the transmission of sexually transmitted diseases (STDs). Talk with your caregiver about which form of contraception is most appropriate for you. Document Released: 12/28/2004 Document Revised: 12/17/2010 Document Reviewed: 05/06/2010 [employer] Patient Information 2012 [employer]. 

## 2011-07-12 NOTE — Progress Notes (Signed)
  Subjective:    Patient ID: Amy [name], female    DOB: 03-01-1985, 26 y.o.   MRN: [id number]  HPI [id number] Patient's last menstrual period was 05/04/2011. She is referred today by Dr. Dorena Cookey. In the last year she has had 4 pregnancy terminations. The last [place] was [place] 3 [place] ago [place]. At the time of the procedure she received Depo-Provera. She would like to discuss contraception options including possible sterilization. She's not entirely certain that she would like to have sterilization and she would consider other options. Past Medical History  Diagnosis Date  . Chlamydia 2008   Past Surgical History  Procedure Date  . No past surgeries   4 elective pregnancy terminations No Known Allergies Current Outpatient Prescriptions on File Prior to Visit  Medication Sig Dispense Refill  . medroxyPROGESTERone (DEPO-PROVERA) 150 MG/ML injection        History   Social History  . Marital Status: Single    Spouse Name: N/A    Number of Children: N/A  . Years of Education: 10   Occupational History  . [employer]   Social History Main Topics  . Smoking status: Former Smoker -- 0.2 packs/day for 2 years    Types: Cigarettes    Quit date: 05/12/2011  . Smokeless tobacco: Never Used   Comment: 2 cigs a day  . Alcohol Use: No  . Drug Use: No  . Sexually Active: Yes   Other Topics Concern  . Not on file   Social History Narrative  . No narrative on file      Review of Systems No vaginal bleeding or pain. No abnormal discharge.    Objective:   Physical Exam  Vitals reviewed. Constitutional: She appears well-developed and well-nourished. No distress.  Psychiatric: She has a normal mood and affect. Her behavior is normal.    Filed Vitals:   07/12/11 1501  BP: 116/64  Pulse: 85  Temp: 98 F (36.7 C)  TempSrc: Oral  Resp: 12  Height: 5\' 7"  (1.702 m)  Weight: 164 lb 11.2 oz (74.707 kg)   Positive urine pregnancy test today        Assessment & Plan:  3 weeks after a termination of pregnancy the patient has a positive pregnancy test. She received Depo-Provera at the time of the termination. She says that she had intercourse about a week and a half after the procedure. A quantitative beta hCG will be performed today and she'll be notified of the result. She may need to have this repeated. In the future we'll discuss her options for contraception including intrauterine device or subdermal implant.  Kaleeya Hancock 07/12/2011 3:41 PM

## 2011-07-13 LAB — HCG, QUANTITATIVE, PREGNANCY: hCG, Beta Chain, Quant, S: 1288.2 m[IU]/mL

## 2011-07-14 ENCOUNTER — Telehealth: Payer: Self-pay

## 2011-07-14 NOTE — Telephone Encounter (Signed)
Called pt and asked pt if she could please come in today to get another beta lab draw.  I explained to the pt that the providers would like to monitor her levels so that they get to < 2.  Pt stated understanding and stated that she would be here today @ 3pm for lab draw. Will notify the front desk of appt.

## 2011-07-14 NOTE — Telephone Encounter (Signed)
Message copied by Faythe Casa on Wed Jul 14, 2011  8:22 AM ------      Message from: Adam Phenix      Created: Tue Jul 13, 2011  2:27 PM       Repeat hcg 7/3

## 2011-08-02 ENCOUNTER — Inpatient Hospital Stay (HOSPITAL_COMMUNITY)
Admission: AD | Admit: 2011-08-02 | Discharge: 2011-08-02 | Disposition: A | Discharge status: Home / Self Care | Payer: Medicaid Other | Source: Ambulatory Visit | Attending: Obstetrics & Gynecology | Admitting: Obstetrics & Gynecology

## 2011-08-02 ENCOUNTER — Encounter (HOSPITAL_COMMUNITY): Payer: Self-pay | Admitting: Advanced Practice Midwife

## 2011-08-02 ENCOUNTER — Telehealth: Payer: Self-pay | Admitting: *Deleted

## 2011-08-02 DIAGNOSIS — O039 Complete or unspecified spontaneous abortion without complication: Secondary | ICD-10-CM | POA: Insufficient documentation

## 2011-08-02 LAB — HCG, QUANTITATIVE, PREGNANCY: hCG, Beta Chain, Quant, S: 47 m[IU]/mL — ABNORMAL HIGH (ref <5)

## 2011-08-02 NOTE — MAU Provider Note (Signed)
  History     CSN: [id number]  Arrival date and time: 08/02/11 1723   None     Chief Complaint  Patient presents with  . Vaginal Bleeding   HPI This is a 26 y.o. female at early stage of pregnancy (unknown) who presents for followup quant. HCG.  She was seen by Dr Debroah Loop s/p a termination and found to be pregnant 18 days after her abortion.  She has had some bleeding since then, though she states it was not heavy. Has also had some cramping.   RN Note: Pt states had TAB 06/24/2011, did receive depo injection same day. Spoke to her MD re: plans for tubal ligation, went to office, had +upt, then had bhcg level drawn, which was 1288 on 07/12/2011. Did have intercourse 1.5 weeks after surgery. Does note blood post intercourse. Does have cramping with intercourse as well.   OB History    Grav Para Term Preterm Abortions TAB SAB Ect Mult Living   8 4 4  0 4 4 0 0 0 4      Past Medical History  Diagnosis Date  . Chlamydia 2008    Past Surgical History  Procedure Date  . Dilation and curettage of uterus     Family History  Problem Relation Age of Onset  . Cancer Maternal Grandfather 74    Breast and cervical   . Diabetes Neg Hx   . Hyperlipidemia Neg Hx   . Hypertension Neg Hx   . Other Neg Hx     History  Substance Use Topics  . Smoking status: Current Everyday Smoker -- 0.2 packs/day for 2 years    Types: Cigarettes    Last Attempt to Quit: 05/12/2011  . Smokeless tobacco: Never Used   Comment: 2 cigs a day  . Alcohol Use: No    Allergies: No Known Allergies  Prescriptions prior to admission  Medication Sig Dispense Refill  . medroxyPROGESTERone (DEPO-PROVERA) 150 MG/ML injection         ROS See HPI.  Physical Exam   Last menstrual period 05/04/2011, not currently breastfeeding.  Physical Exam  Constitutional: She is oriented to person, place, and time. She appears well-developed and well-nourished. No distress.  Cardiovascular: Normal rate.     Respiratory: Effort normal.  Neurological: She is alert and oriented to person, place, and time.  Skin: Skin is warm and dry.  Psychiatric: She has a normal mood and affect.   Results for orders placed during the hospital encounter of 08/02/11 (from the past 24 hour(s))  HCG, QUANTITATIVE, PREGNANCY     Status: Abnormal   Collection Time   08/02/11  6:01 PM      Component Value Range   hCG, Beta Chain, Quant, S 47 (*) <5 mIU/mL    MAU Course  Procedures   Assessment and Plan  A:  Probable SAB      Decreasing quants  P:  Discussed with Dr Penne Lash       Repeat quant in clinic in 2 wks and then schedule a visit in 3 weeks  [facility] 08/02/2011, 7:32 PM

## 2011-08-02 NOTE — MAU Note (Signed)
Pt states had TAB 06/24/2011, did receive depo injection same day. Spoke to her MD re: plans for tubal ligation, went to office, had +upt, then had bhcg level drawn, which was 1288 on 07/12/2011. Did have intercourse 1.5 weeks after surgery. Does note blood post intercourse. Does have cramping with intercourse as well.

## 2011-08-02 NOTE — Telephone Encounter (Signed)
Returned pt call and discussed that she had been contacted on 07/14/11 and it was explained that she needed to have the lab test repeated. She did not keep appt for lab draw that Amy [name].  Pt stated that she does not remember the call from [place]. Pt stated that she can come in on Wed. 08/04/11 for lab draw @ 1500.  Appt scheduled.

## 2011-08-02 NOTE — Telephone Encounter (Signed)
Pt left message stating that she would like to know her test results from 07/12/11.

## 2011-08-02 NOTE — MAU Note (Signed)
Pt concerned if the bhcg level is rising or lowering, is questioning of she is newly pregnant versus hormone level still in her system from TAB in June.

## 2011-08-04 ENCOUNTER — Other Ambulatory Visit: Payer: Medicaid Other

## 2011-08-11 IMAGING — US US OB COMP +14 WK
2 series · 14 of 28 positions shown · non-contrast
Comparison: none

OBSTETRICAL ULTRASOUND:
 This ultrasound exam was performed in the [HOSPITAL] Ultrasound Department.  The OB US report was generated in the AS system, and faxed to the ordering physician.  This report is also available in [HOSPITAL]?s AccessANYware and in [REDACTED] PACS.

[Series 1: us ob comp +14 wk · 0.24mm/px · 68 acquisitions, 13 frames shown (1 of 2)]
[im 3/68]
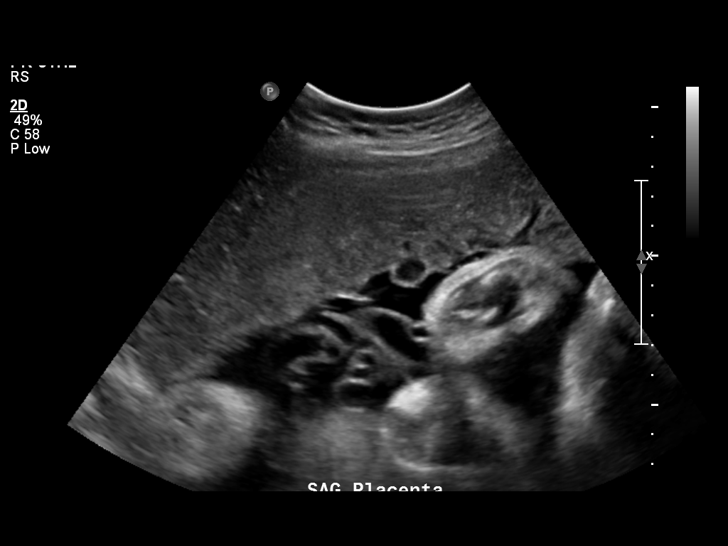
[im 9/68]
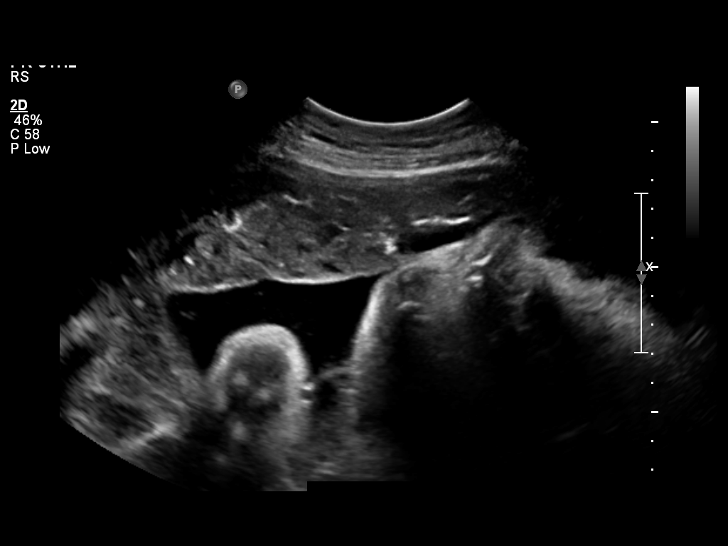
[im 14/68]
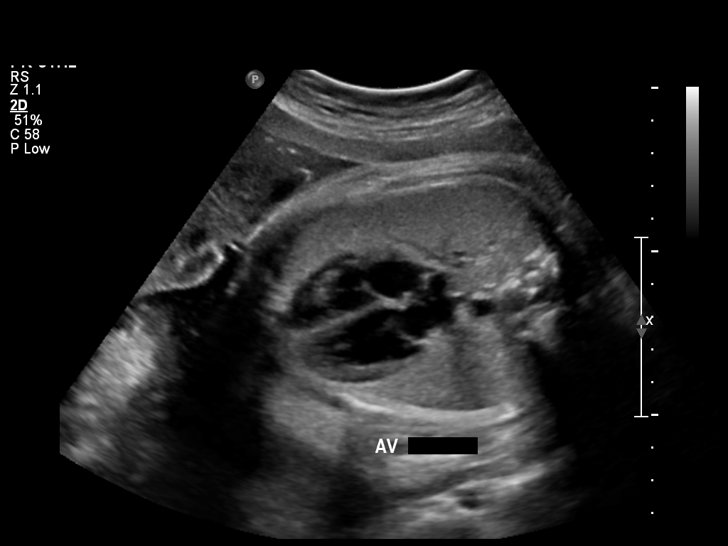
[im 19/68]
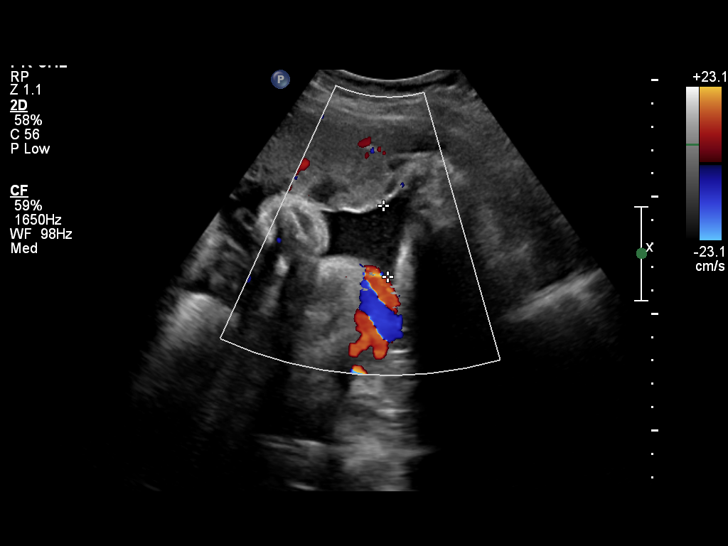
[im 25/68]
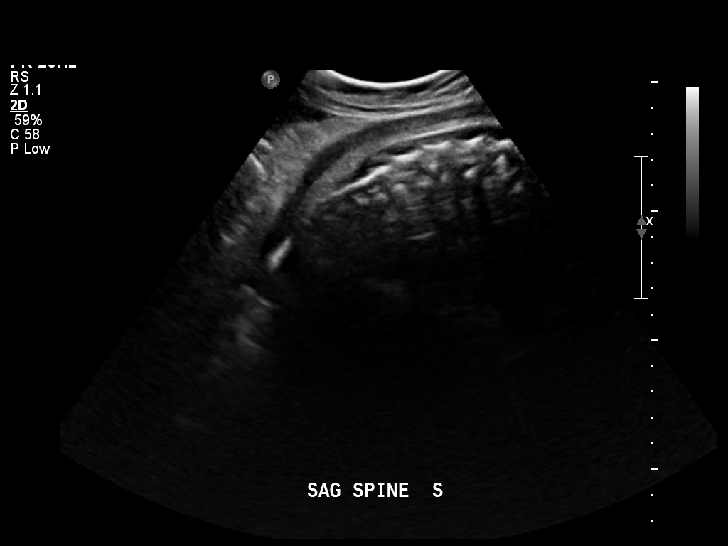
[im 30/68]
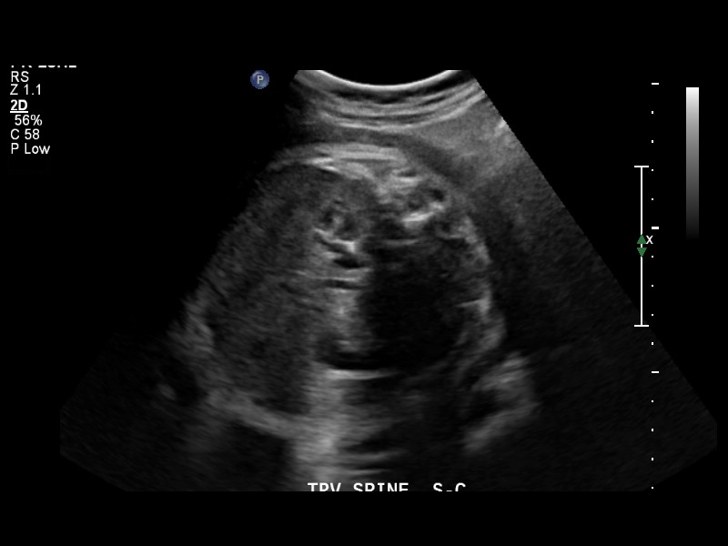
[im 35/68]
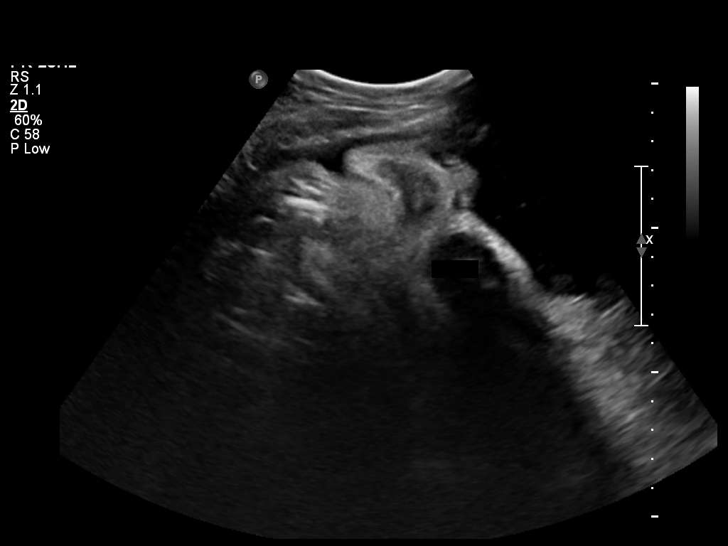
[im 41/68]
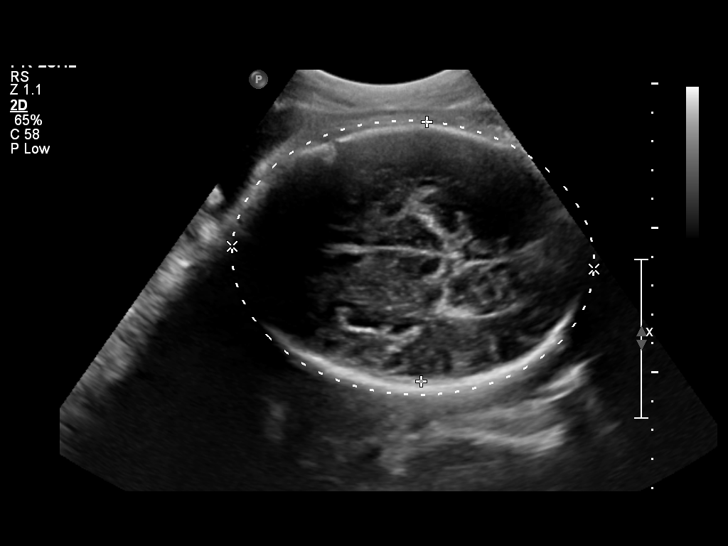
[im 46/68]
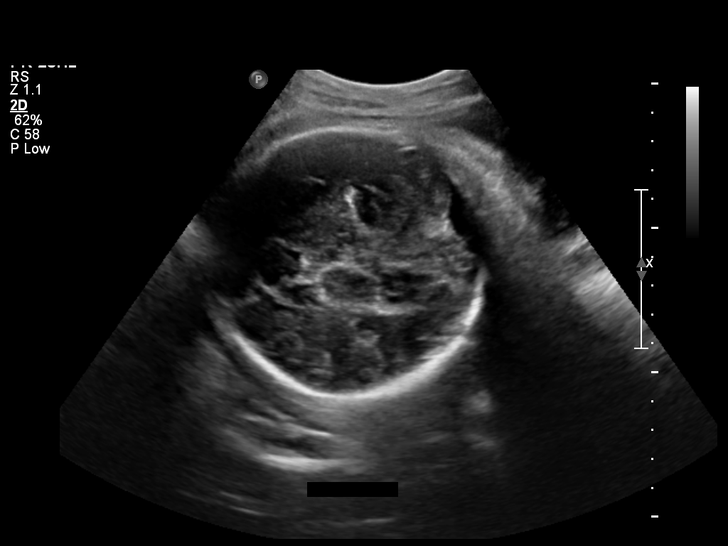
[im 51/68]
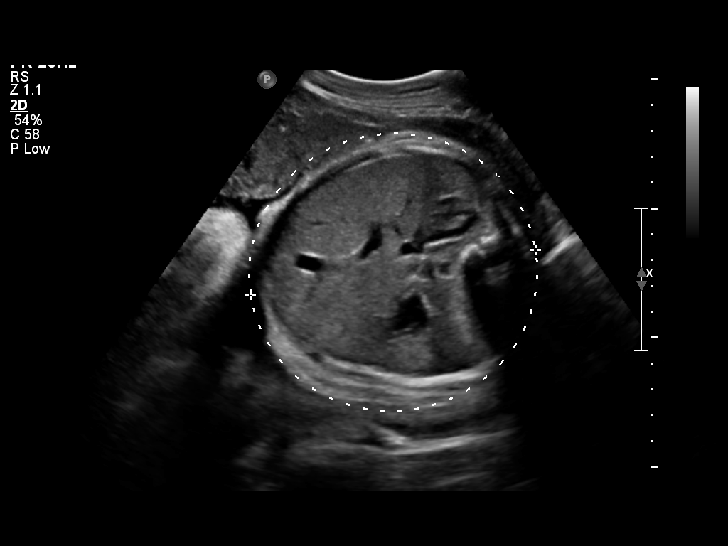
[im 57/68]
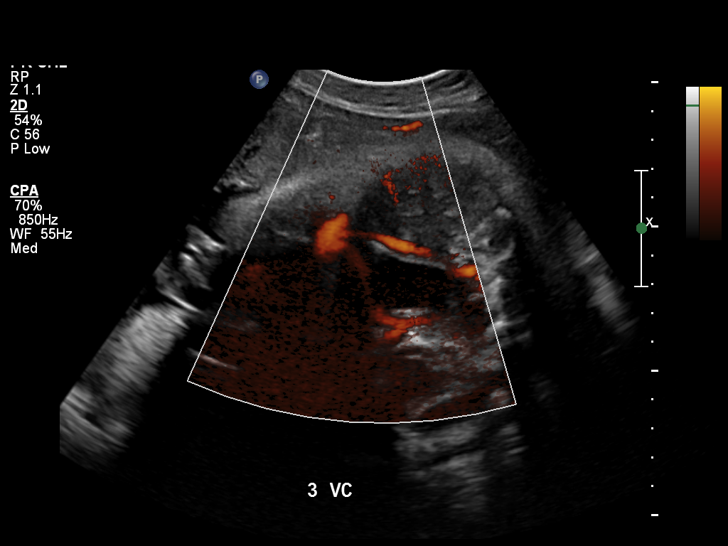
[im 62/68]
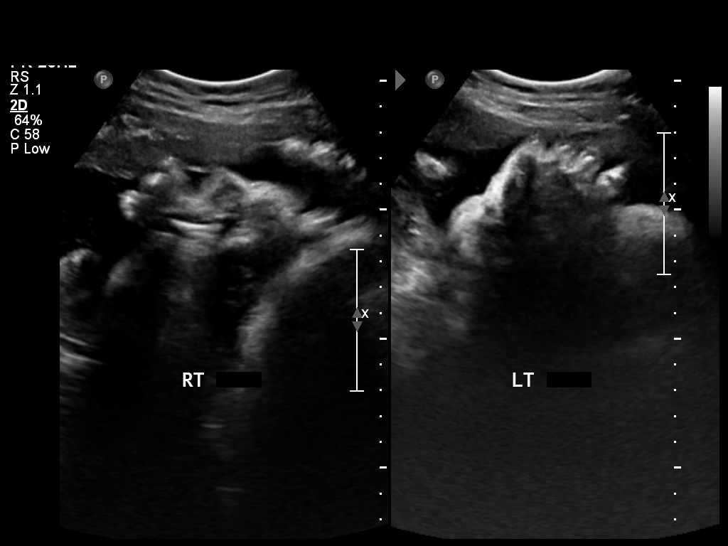
[im 68/68]
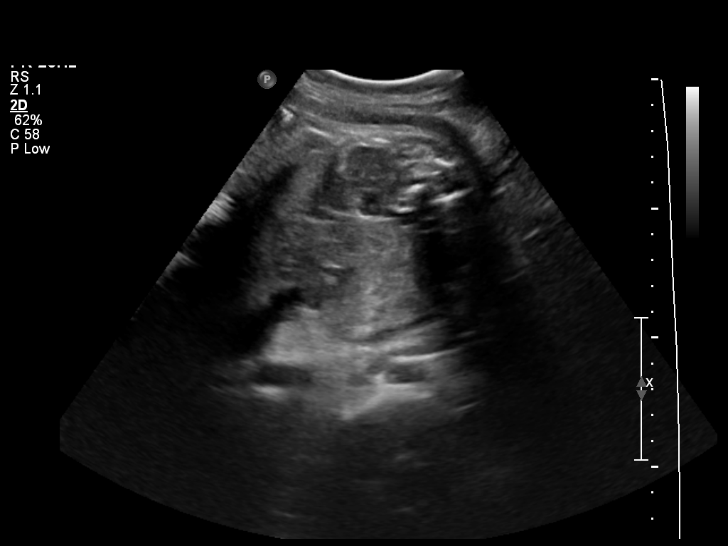

[Series 1: us ob comp +14 wk · 0.22mm/px · 1 of 5 slices shown (2 of 2)]
[im 5/5]
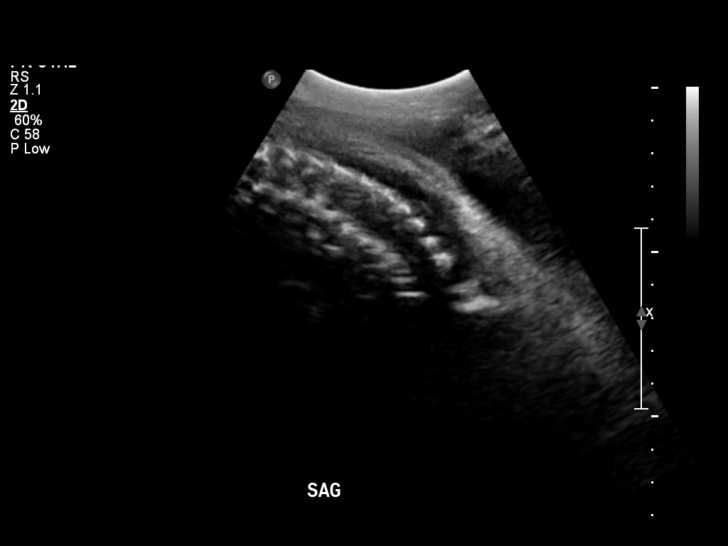

[14 of 28 positions shown; findings below may reference images not displayed]

IMPRESSION: See AS Obstetric US report.

## 2011-08-16 NOTE — MAU Provider Note (Signed)
Medical Screening exam and patient care preformed by advanced practice provider.  Agree with the above management.  

## 2011-08-25 ENCOUNTER — Encounter: Payer: Self-pay | Admitting: Advanced Practice Midwife

## 2011-08-25 ENCOUNTER — Ambulatory Visit (INDEPENDENT_AMBULATORY_CARE_PROVIDER_SITE_OTHER): Payer: Medicaid Other | Admitting: Advanced Practice Midwife

## 2011-08-25 VITALS — BP 128/75 | HR 72 | Temp 98.6°F | Resp 20 | Ht 66.0 in | Wt 170.7 lb

## 2011-08-25 DIAGNOSIS — O074 Failed attempted termination of pregnancy without complication: Secondary | ICD-10-CM

## 2011-08-25 DIAGNOSIS — Z3009 Encounter for other general counseling and advice on contraception: Secondary | ICD-10-CM

## 2011-08-25 NOTE — Patient Instructions (Signed)
Contraception Choices Contraception (birth control) is the use of any methods or devices to prevent pregnancy. Below are some methods to help avoid pregnancy. HORMONAL METHODS   Contraceptive implant. This is a thin, plastic tube containing progesterone hormone. It does not contain estrogen hormone. Your caregiver inserts the tube in the inner part of the upper arm. The tube can remain in place for up to 3 years. After 3 years, the implant must be removed. The implant prevents the ovaries from releasing an egg (ovulation), thickens the cervical mucus which prevents sperm from entering the uterus, and thins the lining of the inside of the uterus.   Progesterone-only injections. These injections are given every 3 months by your caregiver to prevent pregnancy. This synthetic progesterone hormone stops the ovaries from releasing eggs. It also thickens cervical mucus and changes the uterine lining. This makes it harder for sperm to survive in the uterus.   Birth control pills. These pills contain estrogen and progesterone hormone. They work by stopping the egg from forming in the ovary (ovulation). Birth control pills are prescribed by a caregiver.Birth control pills can also be used to treat heavy periods.   Minipill. This type of birth control pill contains only the progesterone hormone. They are taken every day of each month and must be prescribed by your caregiver.   Birth control patch. The patch contains hormones similar to those in birth control pills. It must be changed once a week and is prescribed by a caregiver.   Vaginal ring. The ring contains hormones similar to those in birth control pills. It is left in the vagina for 3 weeks, removed for 1 week, and then a new one is put back in place. The patient must be comfortable inserting and removing the ring from the vagina.A caregiver's prescription is necessary.   Emergency contraception. Emergency contraceptives prevent pregnancy after  unprotected sexual intercourse. This pill can be taken right after sex or up to 5 days after unprotected sex. It is most effective the sooner you take the pills after having sexual intercourse. Emergency contraceptive pills are available without a prescription. Check with your pharmacist. Do not use emergency contraception as your only form of birth control.  BARRIER METHODS   Female condom. This is a thin sheath (latex or rubber) that is worn over the penis during sexual intercourse. It can be used with spermicide to increase effectiveness.   Female condom. This is a soft, loose-fitting sheath that is put into the vagina before sexual intercourse.   Diaphragm. This is a soft, latex, dome-shaped barrier that must be fitted by a caregiver. It is inserted into the vagina, along with a spermicidal jelly. It is inserted before intercourse. The diaphragm should be left in the vagina for 6 to 8 hours after intercourse.   Cervical cap. This is a round, soft, latex or plastic cup that fits over the cervix and must be fitted by a caregiver. The cap can be left in place for up to 48 hours after intercourse.   Sponge. This is a soft, circular piece of polyurethane foam. The sponge has spermicide in it. It is inserted into the vagina after wetting it and before sexual intercourse.   Spermicides. These are chemicals that kill or block sperm from entering the cervix and uterus. They come in the form of creams, jellies, suppositories, foam, or tablets. They do not require a prescription. They are inserted into the vagina with an applicator before having sexual intercourse. The process must be   repeated every time you have sexual intercourse.  INTRAUTERINE CONTRACEPTION  Intrauterine device (IUD). This is a T-shaped device that is put in a woman's uterus during a menstrual period to prevent pregnancy. There are 2 types:   Copper IUD. This type of IUD is wrapped in copper wire and is placed inside the uterus. Copper  makes the uterus and fallopian tubes produce a fluid that kills sperm. It can stay in place for 10 years.   Hormone IUD. This type of IUD contains the hormone progestin (synthetic progesterone). The hormone thickens the cervical mucus and prevents sperm from entering the uterus, and it also thins the uterine lining to prevent implantation of a fertilized egg. The hormone can weaken or kill the sperm that get into the uterus. It can stay in place for 5 years.  PERMANENT METHODS OF CONTRACEPTION  Female tubal ligation. This is when the woman's fallopian tubes are surgically sealed, tied, or blocked to prevent the egg from traveling to the uterus.   Female sterilization. This is when the female has the tubes that carry sperm tied off (vasectomy).This blocks sperm from entering the vagina during sexual intercourse. After the procedure, the man can still ejaculate fluid (semen).  NATURAL PLANNING METHODS  Natural family planning. This is not having sexual intercourse or using a barrier method (condom, diaphragm, cervical cap) on days the woman could become pregnant.   Calendar method. This is keeping track of the length of each menstrual cycle and identifying when you are fertile.   Ovulation method. This is avoiding sexual intercourse during ovulation.   Symptothermal method. This is avoiding sexual intercourse during ovulation, using a thermometer and ovulation symptoms.   Post-ovulation method. This is timing sexual intercourse after you have ovulated.  Regardless of which type or method of contraception you choose, it is important that you use condoms to protect against the transmission of sexually transmitted diseases (STDs). Talk with your caregiver about which form of contraception is most appropriate for you. Document Released: 12/28/2004 Document Revised: 12/17/2010 Document Reviewed: 05/06/2010 [employer] Patient Information 2012 [employer]. 

## 2011-08-25 NOTE — Progress Notes (Signed)
  Subjective:    Patient ID: Amy [name], female    DOB: 1985/06/05, 26 y.o.   MRN: [id number]  HPI the patient is a 26 year old gravida 8 para 4044 2 months status post elective abortion who is been seen in the clinic in maternity admissions for possible incomplete EAB are versus new pregnancy. She is here for followup hCG and contraceptive counseling. She would like to resume Depo-Provera.   Review of Systems: She denies bleeding, abdominal pain, fever, chills, vaginal discharge.     Objective:   Physical Exam General: No apparent distress, alert and oriented x4. Well-appearing, well-nourished African American female. Normal mood. No pallor. Pelvic: Deferred  Results for Amy, [name] (MRN [id number]) as of 08/25/2011 16:48  Ref. Range 07/12/2011 15:35 08/02/2011 18:01  hCG, Beta Chain, Quant, S Latest Range: <5 mIU/mL 1288.2 47 (H)      Assessment & Plan:   1. Incomplete legal abortion  B-HCG Quant  2. General counseling and advice on contraceptive management     If Quant less than 2, may return for Depo-Provera injection as soon as possible. Family instructed to avoid intercourse until Depo-Provera given. Discussed long-acting reversible contraception choices. Patient will consider.  [place], CNM 08/25/2011 5:10 PM

## 2011-08-25 NOTE — Progress Notes (Signed)
Pt is unsure if she wants to continue depo provera injections.  Next dose due 8/29-9/12.

## 2011-08-27 ENCOUNTER — Telehealth: Payer: Self-pay

## 2011-08-27 NOTE — Telephone Encounter (Signed)
Called pt and spoke with pts boyfriend that we are calling to schedule an appt.  Pt's boyfriend stated that pt was at work and that he would give her the message.  I informed him that we do close @ 12 noon today.  He stated understanding.

## 2011-08-27 NOTE — Telephone Encounter (Signed)
Message copied by Faythe Casa on Fri Aug 27, 2011  9:45 AM ------      Message from: [place], [place]      Created: Fri Aug 27, 2011  8:45 AM       Needs quant drawn ~09/01/11 and weekly until >2. Lab only visit. May have Depo when <2.

## 2011-08-30 NOTE — Telephone Encounter (Signed)
LM at contact number to please return call to clinic for appointment information. Patient needs to be scheduled for lab draw by the end of the week and followed for weekly quants until <2.

## 2011-08-31 NOTE — Telephone Encounter (Signed)
Called pt and spoke with pts boyfriend.  Pt's boyfriend stated that she was at work and would give her the message to return our call to the clinics.

## 2011-09-01 NOTE — Telephone Encounter (Signed)
Left a voicemail for patient to call us back asap regarding an appointment.

## 2011-09-02 ENCOUNTER — Other Ambulatory Visit: Payer: Medicaid Other

## 2011-09-02 NOTE — Telephone Encounter (Signed)
Pt has not returned multiple messages from clinic staff. Certified letter to be sent to pt regarding need for weekly follow up lab for [facility] and that she may have depo provera injection for contraception once her pregnancy hormone level is <2.

## 2011-09-02 NOTE — Telephone Encounter (Signed)
Spoke with patient she said she did not get any of the messages from her boyfriend. I informed her that she needs a repeat lab and we will  continue to draw labs until her result is less than 2, at that time she can have her depo. She will come in this afternoon by 4 pm.

## 2011-11-18 ENCOUNTER — Ambulatory Visit: Payer: Medicaid Other | Admitting: Family

## 2011-12-02 ENCOUNTER — Encounter: Payer: Self-pay | Admitting: Family Medicine

## 2011-12-02 ENCOUNTER — Ambulatory Visit (INDEPENDENT_AMBULATORY_CARE_PROVIDER_SITE_OTHER): Payer: Medicaid Other | Admitting: Family Medicine

## 2011-12-02 VITALS — BP 139/78 | HR 78 | Temp 99.2°F | Ht 66.0 in | Wt 174.0 lb

## 2011-12-02 DIAGNOSIS — IMO0001 Reserved for inherently not codable concepts without codable children: Secondary | ICD-10-CM

## 2011-12-02 DIAGNOSIS — Z862 Personal history of diseases of the blood and blood-forming organs and certain disorders involving the immune mechanism: Secondary | ICD-10-CM

## 2011-12-02 DIAGNOSIS — Z331 Pregnant state, incidental: Secondary | ICD-10-CM

## 2011-12-02 DIAGNOSIS — Z113 Encounter for screening for infections with a predominantly sexual mode of transmission: Secondary | ICD-10-CM | POA: Insufficient documentation

## 2011-12-02 DIAGNOSIS — Z309 Encounter for contraceptive management, unspecified: Secondary | ICD-10-CM

## 2011-12-02 DIAGNOSIS — Z3490 Encounter for supervision of normal pregnancy, unspecified, unspecified trimester: Secondary | ICD-10-CM

## 2011-12-02 DIAGNOSIS — Z9189 Other specified personal risk factors, not elsewhere classified: Secondary | ICD-10-CM

## 2011-12-02 DIAGNOSIS — Z9289 Personal history of other medical treatment: Secondary | ICD-10-CM | POA: Insufficient documentation

## 2011-12-02 DIAGNOSIS — Z01812 Encounter for preprocedural laboratory examination: Secondary | ICD-10-CM

## 2011-12-02 LAB — CBC
Platelets: 395 10*3/uL (ref 150–400)
RBC: 5 MIL/uL (ref 3.87–5.11)
WBC: 9.4 10*3/uL (ref 4.0–10.5)

## 2011-12-02 LAB — RPR TITER: RPR Titer: 1:4 {titer}

## 2011-12-02 LAB — RPR: RPR Ser Ql: REACTIVE — AB

## 2011-12-02 MED ORDER — MEDROXYPROGESTERONE ACETATE 150 MG/ML IM SUSP: 150.0000 mg | INTRAMUSCULAR | Status: DC

## 2011-12-02 NOTE — Patient Instructions (Addendum)
Shekela,  Thank you for coming in to see me today.  STD testing done today: HIV test and syphilis test (RPR) Check CBC today (Hgb).  I will call with lab results.  Smoking cessation support: smoking cessation hotline: 1-800-QUIT-NOW.  Here is the number to the smoking cessation classes at 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 If you decide to have another baby you should have you partner tested for sickle cell trait as well before you get pregnant.   F/u yearly for physical or sooner if needed.   Dr. Armen Pickup

## 2011-12-06 ENCOUNTER — Telehealth: Payer: Self-pay | Admitting: Family Medicine

## 2011-12-06 ENCOUNTER — Ambulatory Visit (INDEPENDENT_AMBULATORY_CARE_PROVIDER_SITE_OTHER): Payer: Medicaid Other | Admitting: Obstetrics & Gynecology

## 2011-12-06 ENCOUNTER — Encounter: Payer: Self-pay | Admitting: Family Medicine

## 2011-12-06 ENCOUNTER — Encounter: Payer: Self-pay | Admitting: Obstetrics & Gynecology

## 2011-12-06 ENCOUNTER — Other Ambulatory Visit (HOSPITAL_COMMUNITY)
Admission: RE | Admit: 2011-12-06 | Discharge: 2011-12-06 | Disposition: A | Discharge status: Home / Self Care | Payer: Medicaid Other | Source: Ambulatory Visit | Attending: Obstetrics & Gynecology | Admitting: Obstetrics & Gynecology

## 2011-12-06 VITALS — BP 118/72 | HR 70 | Temp 98.5°F | Ht 64.25 in | Wt 173.0 lb

## 2011-12-06 DIAGNOSIS — Z01419 Encounter for gynecological examination (general) (routine) without abnormal findings: Secondary | ICD-10-CM | POA: Insufficient documentation

## 2011-12-06 NOTE — Telephone Encounter (Signed)
Called patient to give her lab results. Female voice on VM. Will send letter to her home.

## 2011-12-06 NOTE — Progress Notes (Addendum)
Subjective:     Patient ID: Amy [name], female   DOB: 01/19/1985, 26 y.o.   MRN: [id number]  HPI 26 yo F presents for f/u appt to discuss the following:  1. History of syphilis: recurrent abnormal RPR. She was treated during her last pregnancy. She denies ulcercations, rash, known partners with syphilis. She does admits to sores/pimples on her mons with occasional purulent drainage.    2. STD testing: she has pap smears/plevics done at [facility] hospital but request blood testing done today.   3. Positive sickle cell screen: history of positive screen. She was not informed of this before. She does not know if her children have sickle cell trait. No known family history of sickle cell trait.   Review of Systems Patient Information Form: Screening and ROS  AUDIT-C Score: 2 Do you feel safe in relationships? yes PHQ-2:negative  Review of Symptoms  General:  Negative for nexplained weight loss, fever Skin: Negative for new or changing mole, sore that won't heal HEENT: Negative for trouble hearing, trouble seeing, ringing in ears, mouth sores, hoarseness, change in voice, dysphagia. CV:  Negative for chest pain, dyspnea, edema, palpitations Resp: Negative for cough, dyspnea, hemoptysis GI: Negative for nausea, vomiting, diarrhea, constipation, abdominal pain, melena, hematochezia. GU: Negative for dysuria, incontinence, urinary hesitance, hematuria, vaginal or penile discharge, polyuria, sexual difficulty, lumps in testicle or breasts MSK: Negative for muscle cramps or aches, joint pain or swelling Neuro: Negative for headaches, weakness, numbness, dizziness, passing out/fainting Psych: Negative for depression, anxiety, memory problems      Objective:   Physical Exam BP 139/78  Pulse 78  Temp 99.2 F (37.3 C) (Oral)  Ht 5\' 6"  (1.676 m)  Wt 174 lb (78.926 kg)  BMI 28.08 kg/m2 General appearance: alert, cooperative and mild distress Lungs: clear to auscultation  bilaterally Heart: regular rate and rhythm, S1, S2 normal, no murmur, click, rub or gallop Pelvic: lat flesh colored papule on mons.  Extremities: extremities normal, atraumatic, no cyanosis or edema Skin: Skin color, texture, turgor normal. No rashes or lesions    Assessment and Plan:

## 2011-12-06 NOTE — Patient Instructions (Signed)
Contraceptive Injection Information Contraceptive injections protect against pregnancy. Progesterone-only injections are given every 3 months to prevent pregnancy. These injections contain synthetic progesterone hormone. This synthetic progesterone hormone stops the ovaries from releasing eggs. It also thickens cervical mucus and changes the uterine lining. Your caregiver will make sure you are a good candidate for contraceptive injections. Discuss the possible side effects of the injection with your caregiver. ADVANTAGES  They are highly effective and reversible.  They slow down the flow of heavy menstrual periods.  They control premenstrual syndrome (PMS).  They control cramps and painful menstrual periods.  They control irregular menstrual periods.  They are effective in preventing pregnancy when used correctly.  You are always protected when you get the injection on time. DISADVANTAGES  They can be associated with weight gain.  They do not protect against sexually transmitted diseases (STDs).  You must visit your caregiver every 3 months.  The injections may be uncomfortable.  They may cost more than other methods of birth control.  It can take up to 6 months to 2 years to be able to get pregnant (fertility). Document Released: 12/17/2010 Document Revised: 03/22/2011 Document Reviewed: 12/17/2010 ExitCare Patient Information 2013 [employer].  

## 2011-12-06 NOTE — Progress Notes (Signed)
Patient ID: Amy [name], female   DOB: 1985-08-18, 26 y.o.   MRN: [id number]  Chief Complaint  Patient presents with  . Gynecologic Exam    HPI Amy [name] is a 26 y.o. female.  [id number] No LMP recorded. Patient has had an injection. She receives DMPA injections in [facility] and she saw Dr. Armen [name] at [facility] 12/02/11 for complete physical but pap and breast exams were deferred to her appointment today. HPI  Past Medical History  Diagnosis Date  . Chlamydia 2008    Past Surgical History  Procedure Date  . Dilation and curettage of uterus     Family History  Problem Relation Age of Onset  . Cancer Maternal Grandfather 24  . Diabetes Neg Hx   . Hyperlipidemia Neg Hx   . Hypertension Neg Hx   . Other Neg Hx   . Cancer Maternal Grandmother     breast and cervical    Social History History  Substance Use Topics  . Smoking status: Current Every Day Smoker -- 0.2 packs/day for 3 years    Types: Cigarettes    Last Attempt to Quit: 05/12/2011  . Smokeless tobacco: Never Used     Comment: 2 cigs a day  . Alcohol Use: 0.6 oz/week    1 Cans of beer per week    No Known Allergies  Current Outpatient Prescriptions  Medication Sig Dispense Refill  . medroxyPROGESTERone (DEPO-PROVERA) 150 MG/ML injection Inject 1 mL (150 mg total) into the muscle every 3 (three) months.  1 mL      Review of Systems Review of Systems  Constitutional: Negative.   Genitourinary: Negative for dysuria, urgency, vaginal bleeding, vaginal discharge, menstrual problem and pelvic pain.    Blood pressure 118/72, pulse 70, temperature 98.5 F (36.9 C), temperature source Oral, height 5' 4.25" (1.632 m), weight 173 lb (78.472 kg).  Physical Exam Physical Exam  Constitutional: She appears well-developed and well-nourished. No distress.  Pulmonary/Chest: Effort normal. No respiratory distress.       Breasts symmetric, no lesions or masses, no LA  Abdominal: Soft. She exhibits no distension  and no mass. There is no tenderness.  Genitourinary: Vagina normal and uterus normal.       Cervix normal, no mass, pap done  Skin: Skin is warm and dry.  Psychiatric: She has a normal mood and affect. Her behavior is normal.    Data Reviewed Last pap 2011, STD probe no pap 05/2011  Assessment    Normal gyn exam    Plan    Yearly exams, continue DMPA, consider getting all her care at [facility].       Amy [name] 12/06/2011, 5:06 PM

## 2011-12-12 NOTE — Assessment & Plan Note (Signed)
HIV negative. Defer GC/Chlam until pelvic exam.

## 2011-12-12 NOTE — Assessment & Plan Note (Addendum)
normal hgb

## 2011-12-12 NOTE — Assessment & Plan Note (Addendum)
Positive RPR. Low titer and negative T pallidum. No active syphilis.

## 2011-12-12 NOTE — Assessment & Plan Note (Signed)
On depo now.

## 2011-12-29 ENCOUNTER — Ambulatory Visit (INDEPENDENT_AMBULATORY_CARE_PROVIDER_SITE_OTHER): Payer: Medicaid Other | Admitting: Family Medicine

## 2011-12-29 ENCOUNTER — Encounter: Payer: Self-pay | Admitting: Family Medicine

## 2011-12-29 VITALS — BP 143/84 | HR 78 | Ht 62.0 in | Wt 175.0 lb

## 2011-12-29 DIAGNOSIS — Z309 Encounter for contraceptive management, unspecified: Secondary | ICD-10-CM

## 2011-12-29 DIAGNOSIS — B373 Candidiasis of vulva and vagina: Secondary | ICD-10-CM | POA: Insufficient documentation

## 2011-12-29 DIAGNOSIS — IMO0001 Reserved for inherently not codable concepts without codable children: Secondary | ICD-10-CM

## 2011-12-29 LAB — POCT URINE PREGNANCY: Preg Test, Ur: NEGATIVE

## 2011-12-29 MED ORDER — FLUCONAZOLE 150 MG PO TABS: 150.0000 mg | ORAL_TABLET | Freq: Once | ORAL | Status: DC

## 2011-12-29 MED ORDER — LEVONORGESTREL 1.5 MG PO TABS: ORAL_TABLET | ORAL | Status: DC

## 2011-12-29 NOTE — Assessment & Plan Note (Addendum)
A: past due for depo. Negative u preg today.  P:  1. Take plan B- one tab once. Please repeat within 2 hrs if you throw up.  2. Use condoms during sex over the next two weeks at least.  3. Come back for RN visit for repeat urine pregnancy test in 2 weeks. If negative you can get Depo.

## 2011-12-29 NOTE — Patient Instructions (Addendum)
Tulsi,  Thank you for coming in today.   For contraception please do the following:  1. Take plan B- one tab once. Please repeat within 2 hrs if you throw up.  2. Use condoms during sex over the next two weeks at least.  3. Come back for RN visit for repeat urine pregnancy test in 2 weeks. If negative you can get Depo.   For yeast infection: diflucan x one.   Dr. Armen Pickup

## 2011-12-29 NOTE — Progress Notes (Signed)
Subjective:     Patient ID: Amy [name], female   DOB: 1985/03/06, 26 y.o.   MRN: [id number]  HPI 26 yo F presents for contraception management. She is on depo. She is past due for her shot. Last shot 09/13/11 per memory it was not administered here. She last had sex yesterday without condom. LMP, no having periods because of depo.   Also reviewed pap which was normal except for evidence of candidiasis.   Review of Systems As per HPI     Objective:   Physical Exam BP 143/84  Pulse 78  Ht 5\' 2"  (1.575 m)  Wt 175 lb (79.379 kg)  BMI 32.01 kg/m2 General appearance: alert, cooperative and no distress Resp: normal work of breathing.   U preg: negative.      Assessment and Plan:

## 2011-12-29 NOTE — Assessment & Plan Note (Signed)
For yeast infection: diflucan x one.

## 2012-01-13 ENCOUNTER — Ambulatory Visit (INDEPENDENT_AMBULATORY_CARE_PROVIDER_SITE_OTHER): Payer: Medicaid Other | Admitting: *Deleted

## 2012-01-13 DIAGNOSIS — Z309 Encounter for contraceptive management, unspecified: Secondary | ICD-10-CM

## 2012-01-13 MED ORDER — MEDROXYPROGESTERONE ACETATE 150 MG/ML IM SUSP
150.0000 mg | Freq: Once | INTRAMUSCULAR | Status: AC
Start: 2012-01-13 — End: 2012-01-13
  Administered 2012-01-13: 150 mg via INTRAMUSCULAR

## 2012-01-13 NOTE — Progress Notes (Signed)
Urine pregnancy test  performed and is negative.  Patient states she has had protected sex in past 2 weeks.  Depo given today . Next depo due March 20 through April 13, 2012. Advised to  use extra protection for next 7 days.

## 2012-03-30 ENCOUNTER — Ambulatory Visit: Payer: Medicaid Other

## 2012-03-31 ENCOUNTER — Ambulatory Visit: Payer: Medicaid Other

## 2012-04-11 ENCOUNTER — Encounter: Payer: Self-pay | Admitting: Family Medicine

## 2012-04-11 ENCOUNTER — Telehealth: Payer: Self-pay | Admitting: Family Medicine

## 2012-04-11 ENCOUNTER — Ambulatory Visit (INDEPENDENT_AMBULATORY_CARE_PROVIDER_SITE_OTHER): Payer: Medicaid Other | Admitting: Family Medicine

## 2012-04-11 VITALS — BP 133/77 | HR 70 | Temp 99.6°F | Ht 62.0 in | Wt 168.0 lb

## 2012-04-11 DIAGNOSIS — IMO0001 Reserved for inherently not codable concepts without codable children: Secondary | ICD-10-CM

## 2012-04-11 DIAGNOSIS — N898 Other specified noninflammatory disorders of vagina: Secondary | ICD-10-CM

## 2012-04-11 DIAGNOSIS — Z309 Encounter for contraceptive management, unspecified: Secondary | ICD-10-CM

## 2012-04-11 LAB — POCT WET PREP (WET MOUNT): Clue Cells Wet Prep Whiff POC: POSITIVE

## 2012-04-11 MED ORDER — FLUCONAZOLE 150 MG PO TABS: 150.0000 mg | ORAL_TABLET | Freq: Once | ORAL | Status: DC

## 2012-04-11 MED ORDER — MEDROXYPROGESTERONE ACETATE 150 MG/ML IM SUSP
150.0000 mg | Freq: Once | INTRAMUSCULAR | Status: AC
  Administered 2012-04-11: 150 mg via INTRAMUSCULAR

## 2012-04-11 NOTE — Patient Instructions (Addendum)
Alvin,  Thank you for coming in today. I will call with wet prep results.   Dr. Tiana Loft pediatric urologist at our [facility] or one of our satellite clinics, please call [phone]. New patients may call [phone] or [phone]. For your convenience, our pediatric urologists see patients at these locations: [address] [place]

## 2012-04-11 NOTE — Assessment & Plan Note (Signed)
Depo today. 

## 2012-04-11 NOTE — Telephone Encounter (Signed)
Called patient. Left VM.  Yeast on wet prep. Diflucan sent to pharmacy.

## 2012-04-11 NOTE — Progress Notes (Signed)
Subjective:     Patient ID: Amy [name], female   DOB: [birth year], 27 y.o.   MRN: [id number]  HPI 27 yo F presents to discuss the following:  1. Vaginal discharge: started with irritation two weeks ago. Then midl discharge. No odor. No vaginal lesions. Does have lesion on mons where she shaves. Small, tender, raised papules. Sometimes they drain clear-white fluid.   2. Desires tummy tuck: looking into going to the DR. Wants to know if I can send pain medication if she goes to DR. Informed her that I do not advise [place]. If she does go it will be the responsibility of the surgeon to treat her pain and write her out of work. However, I would treat her pain if she presented to the clinic. She states that she may do this sometime next year.   3. Contraception: due for depo. Not overdue.  Review of Systems As per HPI     Objective:   Physical Exam BP 133/77  Pulse 70  Temp(Src) 99.6 F (37.6 C) (Oral)  Ht 5\' 2"  (1.575 m)  Wt 168 lb (76.204 kg)  BMI 30.72 kg/m2 General appearance: alert, cooperative and no distress Pelvic: mons with few hyperpigemented papules, one papule located on R upper mons with central erythema and shallow ulceration evidence of ingrown hair. labia normal w/o lesions. normal vagina. with thick curd like discharge. normal cervix. No CMT, uterine tenderness or adnexal mass or tenderness.      Assessment and Plan:

## 2012-04-11 NOTE — Assessment & Plan Note (Signed)
Yeast on wet prep. Diflucan sent into pharmacy.

## 2012-04-17 ENCOUNTER — Telehealth: Payer: Self-pay | Admitting: Family Medicine

## 2012-04-17 MED ORDER — METRONIDAZOLE 500 MG PO TABS: 500.0000 mg | ORAL_TABLET | Freq: Two times a day (BID) | ORAL | Status: DC

## 2012-04-17 NOTE — Telephone Encounter (Signed)
I can call her tomorrow.  To Dr. Armen Pickup to see if there is anything else they would like for me to tell pt. Amy [name], [name]

## 2012-04-17 NOTE — Telephone Encounter (Signed)
Patient is calling wanting to speak to the nurse for results of a wet prep.

## 2012-04-17 NOTE — Telephone Encounter (Signed)
Patient called  Yeast on BV on wet prep, I just noticed with yeast last week. Sent diflucan to pharmacy last week. Sent flagyl today.

## 2012-05-08 ENCOUNTER — Telehealth: Payer: Self-pay | Admitting: Family Medicine

## 2012-05-08 NOTE — Telephone Encounter (Signed)
Patient is calling because she picked up the Flagyl from the pharmacy but she has lost it and is hoping for another Rx to be sent in to her pharmacy.

## 2012-05-08 NOTE — Telephone Encounter (Signed)
Will forward to PCP 

## 2012-05-09 MED ORDER — METRONIDAZOLE 500 MG PO TABS: 500.0000 mg | ORAL_TABLET | Freq: Two times a day (BID) | ORAL | Status: DC

## 2012-05-09 NOTE — Telephone Encounter (Signed)
Please let pt know when she calls back that rx was sent to the pharmacy. Thanks [employer], CMA

## 2012-05-09 NOTE — Telephone Encounter (Signed)
New flagyl sent. Please inform patient.

## 2012-06-27 ENCOUNTER — Ambulatory Visit (INDEPENDENT_AMBULATORY_CARE_PROVIDER_SITE_OTHER): Payer: Medicaid Other | Admitting: *Deleted

## 2012-06-27 DIAGNOSIS — Z309 Encounter for contraceptive management, unspecified: Secondary | ICD-10-CM

## 2012-06-27 DIAGNOSIS — IMO0001 Reserved for inherently not codable concepts without codable children: Secondary | ICD-10-CM

## 2012-06-27 MED ORDER — MEDROXYPROGESTERONE ACETATE 150 MG/ML IM SUSP
150.0000 mg | Freq: Once | INTRAMUSCULAR | Status: AC
  Administered 2012-06-27: 150 mg via INTRAMUSCULAR

## 2012-06-27 NOTE — Progress Notes (Signed)
Pt here for depo. Depo given RUOQ. Next depo due sept 2-16. Wyatt Haste, RN-BSN

## 2012-08-08 ENCOUNTER — Ambulatory Visit: Payer: Medicaid Other | Admitting: Family Medicine

## 2012-09-12 ENCOUNTER — Ambulatory Visit: Payer: Medicaid Other

## 2012-09-26 ENCOUNTER — Ambulatory Visit (INDEPENDENT_AMBULATORY_CARE_PROVIDER_SITE_OTHER): Payer: Medicaid Other | Admitting: *Deleted

## 2012-09-26 DIAGNOSIS — Z309 Encounter for contraceptive management, unspecified: Secondary | ICD-10-CM

## 2012-09-26 DIAGNOSIS — IMO0001 Reserved for inherently not codable concepts without codable children: Secondary | ICD-10-CM

## 2012-09-26 MED ORDER — MEDROXYPROGESTERONE ACETATE 150 MG/ML IM SUSP
150.0000 mg | Freq: Once | INTRAMUSCULAR | Status: AC
  Administered 2012-09-26: 150 mg via INTRAMUSCULAR

## 2012-12-19 ENCOUNTER — Ambulatory Visit: Payer: Medicaid Other

## 2012-12-26 ENCOUNTER — Encounter: Payer: Self-pay | Admitting: Family Medicine

## 2012-12-26 ENCOUNTER — Other Ambulatory Visit (HOSPITAL_COMMUNITY)
Admission: RE | Admit: 2012-12-26 | Discharge: 2012-12-26 | Disposition: A | Discharge status: Home / Self Care | Payer: Medicaid Other | Source: Ambulatory Visit | Attending: Family Medicine | Admitting: Family Medicine

## 2012-12-26 ENCOUNTER — Ambulatory Visit (INDEPENDENT_AMBULATORY_CARE_PROVIDER_SITE_OTHER): Payer: Medicaid Other | Admitting: Family Medicine

## 2012-12-26 VITALS — BP 128/79 | HR 74 | Temp 98.7°F | Wt 165.0 lb

## 2012-12-26 DIAGNOSIS — Z2089 Contact with and (suspected) exposure to other communicable diseases: Secondary | ICD-10-CM

## 2012-12-26 DIAGNOSIS — B3731 Acute candidiasis of vulva and vagina: Secondary | ICD-10-CM

## 2012-12-26 DIAGNOSIS — B9689 Other specified bacterial agents as the cause of diseases classified elsewhere: Secondary | ICD-10-CM

## 2012-12-26 DIAGNOSIS — Z202 Contact with and (suspected) exposure to infections with a predominantly sexual mode of transmission: Secondary | ICD-10-CM

## 2012-12-26 DIAGNOSIS — N760 Acute vaginitis: Secondary | ICD-10-CM

## 2012-12-26 DIAGNOSIS — Z9189 Other specified personal risk factors, not elsewhere classified: Secondary | ICD-10-CM

## 2012-12-26 DIAGNOSIS — B373 Candidiasis of vulva and vagina: Secondary | ICD-10-CM

## 2012-12-26 DIAGNOSIS — Z20828 Contact with and (suspected) exposure to other viral communicable diseases: Secondary | ICD-10-CM

## 2012-12-26 DIAGNOSIS — A499 Bacterial infection, unspecified: Secondary | ICD-10-CM

## 2012-12-26 DIAGNOSIS — Z862 Personal history of diseases of the blood and blood-forming organs and certain disorders involving the immune mechanism: Secondary | ICD-10-CM

## 2012-12-26 DIAGNOSIS — F17200 Nicotine dependence, unspecified, uncomplicated: Secondary | ICD-10-CM

## 2012-12-26 DIAGNOSIS — Z9289 Personal history of other medical treatment: Secondary | ICD-10-CM

## 2012-12-26 DIAGNOSIS — Z113 Encounter for screening for infections with a predominantly sexual mode of transmission: Secondary | ICD-10-CM | POA: Insufficient documentation

## 2012-12-26 DIAGNOSIS — N898 Other specified noninflammatory disorders of vagina: Secondary | ICD-10-CM

## 2012-12-26 LAB — POCT WET PREP (WET MOUNT)
Clue Cells Wet Prep Whiff POC: POSITIVE
WBC, Wet Prep HPF POC: >20

## 2012-12-26 NOTE — Patient Instructions (Signed)
For your labs (gonorrhea/chlamydia/yeast/bacterial vaginosis/tricohomonas,  I will call you to discuss.   Thanks, Dr. Durene Cal  See me soon to talk about the following and about the "insect bites" you have been having.  Health Maintenance Due  Topic Date Due  . Tetanus/tdap  09/04/2005  . Influenza Vaccine  08/11/2012

## 2012-12-27 ENCOUNTER — Encounter: Payer: Self-pay | Admitting: Family Medicine

## 2012-12-27 MED ORDER — FLUCONAZOLE 150 MG PO TABS: 150.0000 mg | ORAL_TABLET | Freq: Once | ORAL | Status: DC

## 2012-12-27 MED ORDER — METRONIDAZOLE 500 MG PO TABS: 500.0000 mg | ORAL_TABLET | Freq: Two times a day (BID) | ORAL | Status: DC

## 2012-12-27 NOTE — Assessment & Plan Note (Addendum)
HIV/RPR ordered-will reflex to rpr titer and treponemal testing automatically if elevated per lab.

## 2012-12-27 NOTE — Progress Notes (Addendum)
  Tana Conch, MD Phone: [phone]  Subjective:     Amy [name] is a 27 y.o. female who presents for evaluation of an abnormal vaginal discharge. Symptoms have been present for a few days. Vaginal symptoms: discharge described as white and clear and odor. Contraception: Depo-Provera injections. She denies abnormal bleeding, burning, dyspareunia, local irritation, pain, urinary symptoms of urinary frequency and urinary hesitancy and vulvar itching Sexually transmitted infection risk: possible STD exposure. Menstrual flow: rare on depo  Past medical history-current smoker, yeast infection April of this year, history of reactive RPR (low titer)  Review of Systems See above. In addition: Denies nausea/vomiting/fever/chills/fatigue/overall sick feelings.    Objective:    BP 128/79  Pulse 74  Temp(Src) 98.7 F (37.1 C) (Oral)  Wt 165 lb (74.844 kg) General appearance: alert, cooperative and appears stated age Abdomen: soft, non-tender; bowel sounds normal; no masses,  no organomegaly Pelvic: cervix normal in appearance, external genitalia normal, no adnexal masses or tenderness, no cervical motion tenderness and clear to greenish discharge noted     Results for orders placed in visit on 12/26/12 (from the past 24 hour(s))  POCT WET PREP (WET [place])     Status: Abnormal   Collection Time    12/26/12  5:06 PM      Result Value Range   Source Wet Prep POC VAG     WBC, Wet Prep HPF POC >20     Bacteria Wet Prep HPF POC 2+ COCCI     Clue Cells Wet Prep HPF POC Many     CLUE CELLS WET PREP WHIFF POC Positive Whiff     Yeast Wet Prep HPF POC Few     Trichomonas Wet Prep HPF POC None      Assessment:    Bacterial vaginosis and vaginal yeast infection.    Plan:    Oral antifungal see orders-after flagyl Oral antibiotics see orders. Discussed safe sex. f/u prn and for yearly physical  Called patient to inform of results.  GC/chlamydia pending.   Active smoker Now down  to 1-2 a day. Advised cessation. Patient considering a new years resolution of quitting.   History of RPR test HIV/RPR ordered-will reflex to rpr titer and treponemal testing automatically if elevated per lab.   also noted BMI from mid 20s to above 30 but height incorrect from patient (varies from 5 2 to 6 6 in charting-asked patient to make sure she is measured at next appointment).    Orders Placed This Encounter  Procedures  . RPR    Standing Status: Future     Number of Occurrences:      Standing Expiration Date: 12/26/2013  . HIV Antibody    Standing Status: Future     Number of Occurrences:      Standing Expiration Date: 12/26/2013  . POCT Wet Prep [facility])   Meds ordered this encounter  Medications  . metroNIDAZOLE (FLAGYL) 500 MG tablet    Sig: Take 1 tablet (500 mg total) by mouth 2 (two) times daily. Do not use alcohol while taking this medication.    Dispense:  14 tablet    Refill:  0  . fluconazole (DIFLUCAN) 150 MG tablet    Sig: Take 1 tablet (150 mg total) by mouth once. Take after you finished your metronidazole.    Dispense:  1 tablet    Refill:  0

## 2012-12-27 NOTE — Assessment & Plan Note (Signed)
Now down to 1-2 a day. Advised cessation. Patient considering a new years resolution of quitting.

## 2013-01-19 ENCOUNTER — Ambulatory Visit (INDEPENDENT_AMBULATORY_CARE_PROVIDER_SITE_OTHER): Payer: Medicaid Other | Admitting: *Deleted

## 2013-01-19 DIAGNOSIS — Z309 Encounter for contraceptive management, unspecified: Secondary | ICD-10-CM

## 2013-01-19 DIAGNOSIS — IMO0001 Reserved for inherently not codable concepts without codable children: Secondary | ICD-10-CM

## 2013-01-19 LAB — POCT URINE PREGNANCY: Preg Test, Ur: NEGATIVE

## 2013-01-19 MED ORDER — MEDROXYPROGESTERONE ACETATE 150 MG/ML IM SUSP
150.0000 mg | Freq: Once | INTRAMUSCULAR | Status: AC
  Administered 2013-01-19: 150 mg via INTRAMUSCULAR

## 2013-01-19 NOTE — Progress Notes (Signed)
Patient in today for depo. Patient a couple weeks late so urine pregnancy test was obtained with negative results. Injection given in right ventrogluteal, patient without complaints, site unremarkable. Next depo due March 27 - April 10, patient aware.

## 2013-04-20 ENCOUNTER — Ambulatory Visit (INDEPENDENT_AMBULATORY_CARE_PROVIDER_SITE_OTHER): Payer: Medicaid Other | Admitting: *Deleted

## 2013-04-20 DIAGNOSIS — Z309 Encounter for contraceptive management, unspecified: Secondary | ICD-10-CM

## 2013-04-20 DIAGNOSIS — IMO0001 Reserved for inherently not codable concepts without codable children: Secondary | ICD-10-CM

## 2013-04-20 MED ORDER — MEDROXYPROGESTERONE ACETATE 150 MG/ML IM SUSP
150.0000 mg | Freq: Once | INTRAMUSCULAR | Status: AC
  Administered 2013-04-20: 150 mg via INTRAMUSCULAR

## 2013-04-20 NOTE — Progress Notes (Signed)
   Pt in for Depo Provera injection.  Pt tolerated Depo injection. Depo given Right upper outer quadrant.  Next injection due June 26 -July 20, 2013.  Reminder card given. Clovis PuMartin, Tamika L, RN

## 2013-07-17 ENCOUNTER — Ambulatory Visit: Payer: Medicaid Other | Admitting: Family Medicine

## 2013-07-24 ENCOUNTER — Ambulatory Visit: Payer: Medicaid Other | Admitting: Family Medicine

## 2013-08-22 ENCOUNTER — Ambulatory Visit (INDEPENDENT_AMBULATORY_CARE_PROVIDER_SITE_OTHER): Payer: Medicaid Other | Admitting: Family Medicine

## 2013-08-22 ENCOUNTER — Ambulatory Visit (INDEPENDENT_AMBULATORY_CARE_PROVIDER_SITE_OTHER): Payer: Medicaid Other | Admitting: *Deleted

## 2013-08-22 VITALS — BP 118/67 | HR 54 | Temp 98.3°F | Wt 175.0 lb

## 2013-08-22 DIAGNOSIS — Z3009 Encounter for other general counseling and advice on contraception: Secondary | ICD-10-CM

## 2013-08-22 DIAGNOSIS — Z308 Encounter for other contraceptive management: Secondary | ICD-10-CM

## 2013-08-22 LAB — POCT URINE PREGNANCY: PREG TEST UR: NEGATIVE

## 2013-08-22 MED ORDER — MEDROXYPROGESTERONE ACETATE 150 MG/ML IM SUSP
150.0000 mg | Freq: Once | INTRAMUSCULAR | Status: AC
  Administered 2013-08-22: 150 mg via INTRAMUSCULAR

## 2013-08-23 NOTE — Progress Notes (Signed)
Visit changed to Nurse visit for Depo shot

## 2013-12-03 ENCOUNTER — Ambulatory Visit: Payer: Medicaid Other

## 2014-01-30 ENCOUNTER — Encounter (HOSPITAL_COMMUNITY): Payer: Self-pay | Admitting: *Deleted

## 2014-01-30 ENCOUNTER — Telehealth: Payer: Self-pay | Admitting: Obstetrics and Gynecology

## 2014-01-30 ENCOUNTER — Inpatient Hospital Stay (HOSPITAL_COMMUNITY)
Admission: AD | Admit: 2014-01-30 | Discharge: 2014-01-30 | Disposition: A | Discharge status: Home / Self Care | Payer: Medicaid Other | Source: Ambulatory Visit | Attending: Obstetrics & Gynecology | Admitting: Obstetrics & Gynecology

## 2014-01-30 DIAGNOSIS — A6000 Herpesviral infection of urogenital system, unspecified: Secondary | ICD-10-CM | POA: Insufficient documentation

## 2014-01-30 DIAGNOSIS — F17210 Nicotine dependence, cigarettes, uncomplicated: Secondary | ICD-10-CM | POA: Insufficient documentation

## 2014-01-30 HISTORY — DX: Other specified health status: Z78.9

## 2014-01-30 LAB — URINALYSIS, ROUTINE W REFLEX MICROSCOPIC
Bilirubin Urine: NEGATIVE
GLUCOSE, UA: NEGATIVE mg/dL
HGB URINE DIPSTICK: NEGATIVE
Ketones, ur: NEGATIVE mg/dL
Nitrite: NEGATIVE
PROTEIN: NEGATIVE mg/dL
Urobilinogen, UA: 1 mg/dL (ref 0.0–1.0)
pH: 7 (ref 5.0–8.0)

## 2014-01-30 LAB — WET PREP, GENITAL
TRICH WET PREP: NONE SEEN
Yeast Wet Prep HPF POC: NONE SEEN

## 2014-01-30 LAB — URINE MICROSCOPIC-ADD ON

## 2014-01-30 LAB — POCT PREGNANCY, URINE: Preg Test, Ur: NEGATIVE

## 2014-01-30 MED ORDER — ACYCLOVIR 400 MG PO TABS: 400.0000 mg | ORAL_TABLET | Freq: Three times a day (TID) | ORAL | Status: DC

## 2014-01-30 MED ORDER — VALACYCLOVIR HCL 1 G PO TABS: 1000.0000 mg | ORAL_TABLET | Freq: Two times a day (BID) | ORAL | Status: DC

## 2014-01-30 NOTE — MAU Note (Signed)
Hx of BV, has vaginal discharge, recently had unprotected sex with new partner,has ? Outbreak, very painful.  Discharge is odorous, denies bleeding.

## 2014-01-30 NOTE — MAU Note (Signed)
Was on depo, due in November, but hasn't had another dose, no periods.

## 2014-01-30 NOTE — Discharge Instructions (Signed)
Genital Herpes °Genital herpes is a sexually transmitted disease. This means that it is a disease passed by having sex with an infected person. There is no cure for genital herpes. The time between attacks can be months to years. The virus may live in a person but produce no problems (symptoms). This infection can be passed to a baby as it travels down the birth canal (vagina). In a newborn, this can cause central nervous system damage, eye damage, or even death. The virus that causes genital herpes is usually HSV-2 virus. The virus that causes oral herpes is usually HSV-1. The diagnosis (learning what is wrong) is made through culture results. °SYMPTOMS  °Usually symptoms of pain and itching begin a few days to a week after contact. It first appears as small blisters that progress to small painful ulcers which then scab over and heal after several days. It affects the outer genitalia, birth canal, cervix, penis, anal area, buttocks, and thighs. °HOME CARE INSTRUCTIONS  °· Keep ulcerated areas dry and clean. °· Take medications as directed. Antiviral medications can speed up healing. They will not prevent recurrences or cure this infection. These medications can also be taken for suppression if there are frequent recurrences. °· While the infection is active, it is contagious. Avoid all sexual contact during active infections. °· Condoms may help prevent spread of the herpes virus. °· Practice safe sex. °· Wash your hands thoroughly after touching the genital area. °· Avoid touching your eyes after touching your genital area. °· Inform your caregiver if you have had genital herpes and become pregnant. It is your responsibility to insure a safe outcome for your baby in this pregnancy. °· Only take over-the-counter or prescription medicines for pain, discomfort, or fever as directed by your caregiver. °SEEK MEDICAL CARE IF:  °· You have a recurrence of this infection. °· You do not respond to medications and are not  improving. °· You have new sources of pain or discharge which have changed from the original infection. °· You have an oral temperature above 102° F (38.9° C). °· You develop abdominal pain. °· You develop eye pain or signs of eye infection. °Document Released: 12/26/1999 Document Revised: 03/22/2011 Document Reviewed: 01/15/2009 °ExitCare® Patient Information ©2015 [employer]. This information is not intended to replace advice given to you by your health care provider. Make sure you discuss any questions you have with your health care provider. ° °

## 2014-01-30 NOTE — MAU Note (Signed)
Having "severe" pain on outer vaginal area; states that she thinks she has a "outbreak"; having discharge with odor also for past 3 days;

## 2014-01-30 NOTE — MAU Provider Note (Signed)
History     CSN: [id number]  Arrival date and time: 01/30/14 4098   First Provider Initiated Contact with Patient 01/30/14 1207      Chief Complaint  Patient presents with  . Vaginal Discharge  . possible genital oubreak    HPI    Amy [name] is a 29 y.o. female [phone] who presents with vaginal soreness and with concerns of a bump on her vagina that she noticed this weekend. She had a new sexual partner this past weekend and started experiencing discomfort in her vaginal area following intercourse.   She does not use condoms regularly.    OB History    Gravida Para Term Preterm AB TAB SAB Ectopic Multiple Living   0 4 4 0 0 0 4      Past Medical History  Diagnosis Date  . Chlamydia 2008  . Medical history non-contributory     Past Surgical History  Procedure Laterality Date  . No past surgeries      Family History  Problem Relation Age of Onset  . Cancer Maternal Grandfather 78  . Diabetes Neg Hx   . Hyperlipidemia Neg Hx   . Hypertension Neg Hx   . Other Neg Hx   . Cancer Maternal Grandmother     breast and cervical    History  Substance Use Topics  . Smoking status: Current Every Day Smoker -- 0.25 packs/day for 3 years    Types: Cigarettes  . Smokeless tobacco: Never Used     Comment: 4 cigs a day  . Alcohol Use: 0.6 oz/week    1 Cans of beer per week    Allergies: No Known Allergies  Prescriptions prior to admission  Medication Sig Dispense Refill Last Dose  . fluconazole (DIFLUCAN) 150 MG tablet Take 1 tablet (150 mg total) by mouth once. Take after you finished your metronidazole. (Patient not taking: Reported on 01/30/2014) 1 tablet 0   . medroxyPROGESTERone (DEPO-PROVERA) 150 MG/ML injection Inject 1 mL (150 mg total) into the muscle every 3 (three) months. (Patient not taking: Reported on 01/30/2014) 1 mL  Not Taking  . metroNIDAZOLE (FLAGYL) 500 MG tablet Take 1 tablet (500 mg total) by mouth 2 (two) times daily. Do not use  alcohol while taking this medication. (Patient not taking: Reported on 01/30/2014) 14 tablet 0    Results for orders placed or performed during the hospital encounter of 01/30/14 (from the past 48 hour(s))  Urinalysis, Routine w reflex microscopic     Status: Abnormal   Collection Time: 01/30/14 10:00 AM  Result Value Ref Range   Color, Urine YELLOW YELLOW   APPearance CLEAR CLEAR   Specific Gravity, Urine <1.005 (L) 1.005 - 1.030   pH 7.0 5.0 - 8.0   Glucose, UA NEGATIVE NEGATIVE mg/dL   Hgb urine dipstick NEGATIVE NEGATIVE   Bilirubin Urine NEGATIVE NEGATIVE   Ketones, ur NEGATIVE NEGATIVE mg/dL   Protein, ur NEGATIVE NEGATIVE mg/dL   Urobilinogen, UA 1.0 0.0 - 1.0 mg/dL   Nitrite NEGATIVE NEGATIVE   Leukocytes, UA SMALL (A) NEGATIVE  Urine microscopic-add on     Status: Abnormal   Collection Time: 01/30/14 10:00 AM  Result Value Ref Range   Squamous Epithelial / LPF FEW (A) RARE   WBC, UA 3-6 <3 WBC/hpf   Bacteria, UA RARE RARE  Pregnancy, urine POC     Status: None   Collection Time: 01/30/14 10:13 AM  Result Value Ref Range   Preg  Test, Ur NEGATIVE NEGATIVE    Comment:        THE SENSITIVITY OF THIS METHODOLOGY IS >24 mIU/mL   Wet prep, genital     Status: Abnormal   Collection Time: 01/30/14  1:00 PM  Result Value Ref Range   Yeast Wet Prep HPF POC NONE SEEN NONE SEEN   Trich, Wet Prep NONE SEEN NONE SEEN   Clue Cells Wet Prep HPF POC FEW (A) NONE SEEN   WBC, Wet Prep HPF POC FEW (A) NONE SEEN    Comment: MODERATE BACTERIA SEEN    Review of Systems  Constitutional: Negative for fever and chills.  Gastrointestinal: Negative for nausea and vomiting.  Genitourinary: Negative for dysuria, urgency, frequency and hematuria.   Physical Exam   Blood pressure 144/79, pulse 102, temperature 99.6 F (37.6 C), temperature source Oral, resp. rate 16, height 5\' 5"  (1.651 m), weight 82.555 kg (182 lb).  Physical Exam  Constitutional: She is oriented to person, place, and  time. She appears well-developed and well-nourished. No distress.  HENT:  Head: Normocephalic.  Eyes: Pupils are equal, round, and reactive to light.  Neck: Neck supple.  Cardiovascular: Normal rate and normal heart sounds.   Respiratory: Effort normal.  Genitourinary: There is tenderness and lesion (Pea size cluster of open lesions/ blisters; friable. ) on the right labia. Cervix exhibits friability. Cervix exhibits no motion tenderness and no discharge.    Musculoskeletal: Normal range of motion.  Neurological: She is alert and oriented to person, place, and time.  Skin: Skin is warm. She is not diaphoretic.  Psychiatric: Her behavior is normal.    MAU Course  Procedures None  MDM HIV RPR  Wet prep GC UA   Assessment and Plan   A:  Genital herpes; primary outbreak  P:  Discharge home in stable condition RX: Valtrex GC/ Chlamydia swabs pending Condoms always Patient needs to inform her partner.    Iona HansenJennifer Irene Nyanna Heideman, NP 01/30/2014 2:03 PM

## 2014-01-30 NOTE — Telephone Encounter (Signed)
Patient called and states that the valtrex is to expensive for her to fill. She requests another option.  Acyclovir called in

## 2014-01-31 LAB — HIV ANTIBODY (ROUTINE TESTING W REFLEX)
HIV 1/O/2 Abs-Index Value: <1 (ref <1.00)
HIV-1/HIV-2 Ab: NONREACTIVE

## 2014-01-31 LAB — GC/CHLAMYDIA PROBE AMP (~~LOC~~) NOT AT ARMC
CHLAMYDIA, DNA PROBE: NEGATIVE
Neisseria Gonorrhea: NEGATIVE

## 2014-01-31 LAB — RPR: RPR Ser Ql: REACTIVE — AB

## 2014-02-01 LAB — RPR, QUANT+TP ABS (REFLEX)
Rapid Plasma Reagin, Quant: 1:2 {titer} — ABNORMAL HIGH
Rapid Plasma Reagin, Quant: 1:2 {titer} — ABNORMAL HIGH
T Pallidum Abs: NEGATIVE
T Pallidum Abs: NEGATIVE

## 2014-02-01 LAB — HERPES SIMPLEX VIRUS CULTURE
CULTURE: DETECTED
Special Requests: NORMAL

## 2014-02-02 ENCOUNTER — Inpatient Hospital Stay (HOSPITAL_COMMUNITY)
Admission: AD | Admit: 2014-02-02 | Discharge: 2014-02-02 | Disposition: A | Discharge status: Home / Self Care | Payer: Self-pay | Source: Ambulatory Visit | Attending: Obstetrics and Gynecology | Admitting: Obstetrics and Gynecology

## 2014-02-02 ENCOUNTER — Encounter (HOSPITAL_COMMUNITY): Payer: Self-pay | Admitting: *Deleted

## 2014-02-02 DIAGNOSIS — K1379 Other lesions of oral mucosa: Secondary | ICD-10-CM

## 2014-02-02 DIAGNOSIS — A6000 Herpesviral infection of urogenital system, unspecified: Secondary | ICD-10-CM | POA: Insufficient documentation

## 2014-02-02 DIAGNOSIS — Z202 Contact with and (suspected) exposure to infections with a predominantly sexual mode of transmission: Secondary | ICD-10-CM

## 2014-02-02 DIAGNOSIS — F17210 Nicotine dependence, cigarettes, uncomplicated: Secondary | ICD-10-CM | POA: Insufficient documentation

## 2014-02-02 DIAGNOSIS — N898 Other specified noninflammatory disorders of vagina: Secondary | ICD-10-CM | POA: Insufficient documentation

## 2014-02-02 LAB — RAPID STREP SCREEN (MED CTR MEBANE ONLY): Streptococcus, Group A Screen (Direct): NEGATIVE

## 2014-02-02 LAB — WET PREP, GENITAL
Trich, Wet Prep: NONE SEEN
Yeast Wet Prep HPF POC: NONE SEEN

## 2014-02-02 MED ORDER — CEFTRIAXONE SODIUM 250 MG IJ SOLR
250.0000 mg | INTRAMUSCULAR | Status: AC
Start: 2014-02-02 — End: 2014-02-02
  Administered 2014-02-02: 250 mg via INTRAMUSCULAR
  Filled 2014-02-02: qty 250

## 2014-02-02 MED ORDER — AZITHROMYCIN 250 MG PO TABS: ORAL_TABLET | ORAL | Status: DC

## 2014-02-02 MED ORDER — ACYCLOVIR 400 MG PO TABS: 400.0000 mg | ORAL_TABLET | Freq: Three times a day (TID) | ORAL | Status: DC

## 2014-02-02 NOTE — MAU Provider Note (Signed)
Chief Complaint: vaginal blisters    First Provider Initiated Contact with Patient 02/02/14 1845     SUBJECTIVE HPI: Amy [name] is a 29 y.o. [id number] who presents to maternity admissions reporting recent diagnosis on 1/20 with genital herpes and new onset of blisters on her tongue and throat pain.  She also reports flu-like symptoms with fever/chills, sore throat, and runny/stuffy nose x 2 days.  She reports her throat hurt so much yesterday that she had trouble swallowing. She also reports an increase in vaginal discharge with odor and she is worried that she was exposed to other STDs 1 week ago with new sexual partner.  Her gonorrhea, chlamydia, syphilis, and HIV tests were negative on 01/30/14 but she reports her vaginal discharge with odor is worsening.  She denies urinary symptoms, h/a, dizziness, or n/v.     Past Medical History  Diagnosis Date  . Chlamydia 2008  . Medical history non-contributory    Past Surgical History  Procedure Laterality Date  . No past surgeries     History   Social History  . Marital Status: Single    Spouse Name: N/A    Number of Children: N/A  . Years of Education: 10   Occupational History  . [employer]   Social History Main Topics  . Smoking status: Current Every Day Smoker -- 0.25 packs/day for 3 years    Types: Cigarettes  . Smokeless tobacco: Never Used     Comment: 4 cigs a day  . Alcohol Use: 0.6 oz/week    1 Cans of beer per week  . Drug Use: No  . Sexual Activity: Yes    Birth Control/ Protection: Injection   Other Topics Concern  . Not on file   Social History Narrative   No current facility-administered medications on file prior to encounter.   Current Outpatient Prescriptions on File Prior to Encounter  Medication Sig Dispense Refill  . acyclovir (ZOVIRAX) 400 MG tablet Take 1 tablet (400 mg total) by mouth 3 (three) times daily. 30 tablet 0  . valACYclovir (VALTREX) 1000 MG tablet Take 1 tablet (1,000 mg total) by  mouth 2 (two) times daily. 20 tablet 0   No Known Allergies  ROS: Pertinent items in HPI  OBJECTIVE There were no vitals taken for this visit. GENERAL: Well-developed, well-nourished female in no acute distress.  HEENT: Normocephalic Physical Examination: Ears - bilateral TM's and external ear canals normal Physical Examination: Mouth - One 0.5 cm flat painless lesion on middle left side of tongue, white/yellow coating on middle of tongue, several tiny blister-like lesions on tonsils and surrounding throat area with significant erythema of the area HEART: normal rate RESP: normal effort ABDOMEN: Soft, non-tender EXTREMITIES: Nontender, no edema NEURO: Alert and oriented  Visual inspection of external genitals reveals 2-3 open lesions on right and left labia that are painful to touch.    MAU Management: Rapid strep throat swab collected--lab unsure of result time or if results available in 24 hours r/t inclement weather with transporting test  GCC and wet prep recollected (recent testing negative but onset of new symptoms) HSV swab of mouth lesions collected  Treatment for exposure to chlamydia/gonorrhea given: Rocephin 250 mg IM x 1 dose in MAU Rx for azithromycin to cover chlamydia AND possible strep pharyngitis  ASSESSMENT 1. Genital HSV   2. Mouth sores   3. Vaginal discharge   4. Possible exposure to STD     PLAN Discharge home Education about HSV and STD  exposure/treatment/prevention done.  Questions answered.  Azithromycin (Z-pak) taper Rx sent to pharmacy Continue acyclovir as prescribed.  Refill for recurrent outbreaks sent to pharmacy. F/U with Gyn if genital symptoms worsen/persist F/U with primary care if mouth/throat symptoms worsen/persist    Medication List    STOP taking these medications        valACYclovir 1000 MG tablet  Commonly known as:  VALTREX      TAKE these medications        acyclovir 400 MG tablet  Commonly known as:  ZOVIRAX  Take 1  tablet (400 mg total) by mouth 3 (three) times daily.     acyclovir 400 MG tablet  Commonly known as:  ZOVIRAX  Take 1 tablet (400 mg total) by mouth 3 (three) times daily.     azithromycin 250 MG tablet  Commonly known as:  ZITHROMAX  Take 2 tablets on Day 1, then take 1 tablet daily.       Follow-up Information    Please follow up.   Why:  With your Gyn provider on 02/26/14, As scheduled      Sharen CounterLisa Leftwich-Kirby Certified Nurse-Midwife 02/02/2014  7:40 PM

## 2014-02-02 NOTE — Discharge Instructions (Signed)
Strep Throat Strep throat is an infection of the throat caused by a bacteria named Streptococcus pyogenes. Your health care provider may call the infection streptococcal "tonsillitis" or "pharyngitis" depending on whether there are signs of inflammation in the tonsils or back of the throat. Strep throat is most common in children aged 28-15 years during the cold months of the year, but it can occur in people of any age during any season. This infection is spread from person to person (contagious) through coughing, sneezing, or other close contact. SIGNS AND SYMPTOMS   Fever or chills.  Painful, swollen, red tonsils or throat.  Pain or difficulty when swallowing.  White or yellow spots on the tonsils or throat.  Swollen, tender lymph nodes or "glands" of the neck or under the jaw.  Red rash all over the body (rare). DIAGNOSIS  Many different infections can cause the same symptoms. A test must be done to confirm the diagnosis so the right treatment can be given. A "rapid strep test" can help your health care provider make the diagnosis in a few minutes. If this test is not available, a light swab of the infected area can be used for a throat culture test. If a throat culture test is done, results are usually available in a day or two. TREATMENT  Strep throat is treated with antibiotic medicine. HOME CARE INSTRUCTIONS   Gargle with 1 tsp of salt in 1 cup of warm water, 3-4 times per day or as needed for comfort.  Family members who also have a sore throat or fever should be tested for strep throat and treated with antibiotics if they have the strep infection.  Make sure everyone in your household washes their hands well.  Do not share food, drinking cups, or personal items that could cause the infection to spread to others.  You may need to eat a soft food diet until your sore throat gets better.  Drink enough water and fluids to keep your urine clear or pale yellow. This will help  prevent dehydration.  Get plenty of rest.  Stay home from school, day care, or work until you have been on antibiotics for 24 hours.  Take medicines only as directed by your health care provider.  Take your antibiotic medicine as directed by your health care provider. Finish it even if you start to feel better. SEEK MEDICAL CARE IF:   The glands in your neck continue to enlarge.  You develop a rash, cough, or earache.  You cough up green, yellow-brown, or bloody sputum.  You have pain or discomfort not controlled by medicines.  Your problems seem to be getting worse rather than better.  You have a fever. SEEK IMMEDIATE MEDICAL CARE IF:   You develop any new symptoms such as vomiting, severe headache, stiff or painful neck, chest pain, shortness of breath, or trouble swallowing.  You develop severe throat pain, drooling, or changes in your voice.  You develop swelling of the neck, or the skin on the neck becomes red and tender.  You develop signs of dehydration, such as fatigue, dry mouth, and decreased urination.  You become increasingly sleepy, or you cannot wake up completely. MAKE SURE YOU:  Understand these instructions.  Will watch your condition.  Will get help right away if you are not doing well or get worse. Document Released: 12/26/1999 Document Revised: 05/14/2013 Document Reviewed: 02/26/2010 [facility] Patient Information 2015 [place], [place]. This information is not intended to replace advice given to you by  your health care provider. Make sure you discuss any questions you have with your health care provider.  Genital Herpes Genital herpes is a sexually transmitted disease. This means that it is a disease passed by having sex with an infected person. There is no cure for genital herpes. The time between attacks can be months to years. The virus may live in a person but produce no problems (symptoms). This infection can be passed to a baby as it travels down  the birth canal (vagina). In a newborn, this can cause central nervous system damage, eye damage, or even death. The virus that causes genital herpes is usually HSV-2 virus. The virus that causes oral herpes is usually HSV-1. The diagnosis (learning what is wrong) is made through culture results. SYMPTOMS  Usually symptoms of pain and itching begin a few days to a week after contact. It first appears as small blisters that progress to small painful ulcers which then scab over and heal after several days. It affects the outer genitalia, birth canal, cervix, penis, anal area, buttocks, and thighs. HOME CARE INSTRUCTIONS   Keep ulcerated areas dry and clean.  Take medications as directed. Antiviral medications can speed up healing. They will not prevent recurrences or cure this infection. These medications can also be taken for suppression if there are frequent recurrences.  While the infection is active, it is contagious. Avoid all sexual contact during active infections.  Condoms may help prevent spread of the herpes virus.  Practice safe sex.  Wash your hands thoroughly after touching the genital area.  Avoid touching your eyes after touching your genital area.  Inform your caregiver if you have had genital herpes and become pregnant. It is your responsibility to insure a safe outcome for your baby in this pregnancy.  Only take over-the-counter or prescription medicines for pain, discomfort, or fever as directed by your caregiver. SEEK MEDICAL CARE IF:   You have a recurrence of this infection.  You do not respond to medications and are not improving.  You have new sources of pain or discharge which have changed from the original infection.  You have an oral temperature above 102 F (38.9 C).  You develop abdominal pain.  You develop eye pain or signs of eye infection. Document Released: 12/26/1999 Document Revised: 03/22/2011 Document Reviewed: 01/15/2009 [facility] Patient  Information 2015 [place]. This information is not intended to replace advice given to you by your health care provider. Make sure you discuss any questions you have with your health care provider.

## 2014-02-02 NOTE — MAU Note (Signed)
Pt was recently diagnosed with HSV.  Sores have gotten worse, reports s/s of flu but have gone away, saw a spot on her tongue that is painful.  Feels like "something is in her esophagus".

## 2014-02-03 ENCOUNTER — Telehealth: Payer: Self-pay | Admitting: Medical

## 2014-02-03 NOTE — Telephone Encounter (Signed)
Called patient to inform her of +HSV 1 on vaginal culture. Patient also had oral lesions that were evaluated a few days later and that culture is pending. Patient states symptoms have not improved much since taking the acyclovir x 3 days. Advised to continue medicine for full course and follow-up with [facility] if symptoms persist or worsen. Patient voiced understanding.   Marny LowensteinJulie N Christin Moline, PA-C 02/03/2014 2:03 PM

## 2014-02-04 LAB — HERPES SIMPLEX VIRUS CULTURE
Culture: DETECTED
Special Requests: NORMAL

## 2014-02-04 LAB — GC/CHLAMYDIA PROBE AMP (~~LOC~~) NOT AT ARMC
Chlamydia: NEGATIVE
NEISSERIA GONORRHEA: NEGATIVE

## 2014-02-05 ENCOUNTER — Ambulatory Visit (INDEPENDENT_AMBULATORY_CARE_PROVIDER_SITE_OTHER): Payer: Self-pay | Admitting: Family Medicine

## 2014-02-05 ENCOUNTER — Encounter: Payer: Self-pay | Admitting: Family Medicine

## 2014-02-05 VITALS — BP 117/79 | HR 81 | Temp 98.4°F | Wt 180.0 lb

## 2014-02-05 DIAGNOSIS — B009 Herpesviral infection, unspecified: Secondary | ICD-10-CM | POA: Insufficient documentation

## 2014-02-05 LAB — CULTURE, GROUP A STREP

## 2014-02-05 NOTE — Assessment & Plan Note (Signed)
In the midst of primary outbreak.  Flu-like symptoms resolving.  Still with painful oral lesions - recommend continue acyclovir and phenol spray prior to eating.

## 2014-02-05 NOTE — Patient Instructions (Signed)
Great to meet you!  Continue the acyclovir  Follow up with Dr. Gayla DossJoyner in 3-4 weeks.   Try Chloraseptic spray before eating to numb the pain.

## 2014-02-05 NOTE — Progress Notes (Signed)
Patient ID: Amy KettleCandace M [name], female   DOB: [birth year], 29 y.o.   MRN: [id number]   HPI  Patient presents today for painful oral lesions.  Patient explains that she is diagnosed with new herpes simplex genital and oral infection on January 20. She's been taking acyclovir for the last 5 days. She states that initially she had flulike illness which has resolved.  She's taking 400 mg acyclovir 3 times a day  She states that [facility] after the lesions worsened. She states that since she was seen 3 days ago there she's developed 1 new lesion in her mouth and they continue to be painful.  She states that she has been treated for syphilis 2011 after the birth of her child.  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  Flu-like symptoms resolving.  Still with painful oral lesions - recommend continue acyclovir and phenol spray prior to eating.

## 2014-02-26 ENCOUNTER — Encounter: Payer: Self-pay | Admitting: Family Medicine

## 2014-07-04 ENCOUNTER — Ambulatory Visit (INDEPENDENT_AMBULATORY_CARE_PROVIDER_SITE_OTHER): Payer: Self-pay | Admitting: Family Medicine

## 2014-07-04 ENCOUNTER — Encounter: Payer: Self-pay | Admitting: Family Medicine

## 2014-07-04 ENCOUNTER — Other Ambulatory Visit (HOSPITAL_COMMUNITY)
Admission: RE | Admit: 2014-07-04 | Discharge: 2014-07-04 | Disposition: A | Discharge status: Home / Self Care | Payer: Medicaid Other | Source: Ambulatory Visit | Attending: Family Medicine | Admitting: Family Medicine

## 2014-07-04 VITALS — BP 129/65 | HR 80 | Temp 98.3°F | Ht 65.0 in | Wt 184.8 lb

## 2014-07-04 DIAGNOSIS — Z862 Personal history of diseases of the blood and blood-forming organs and certain disorders involving the immune mechanism: Secondary | ICD-10-CM

## 2014-07-04 DIAGNOSIS — Z3490 Encounter for supervision of normal pregnancy, unspecified, unspecified trimester: Secondary | ICD-10-CM

## 2014-07-04 DIAGNOSIS — Z113 Encounter for screening for infections with a predominantly sexual mode of transmission: Secondary | ICD-10-CM | POA: Diagnosis present

## 2014-07-04 DIAGNOSIS — Z202 Contact with and (suspected) exposure to infections with a predominantly sexual mode of transmission: Secondary | ICD-10-CM

## 2014-07-04 DIAGNOSIS — Z20828 Contact with and (suspected) exposure to other viral communicable diseases: Secondary | ICD-10-CM

## 2014-07-04 DIAGNOSIS — Z331 Pregnant state, incidental: Secondary | ICD-10-CM

## 2014-07-04 DIAGNOSIS — Z01419 Encounter for gynecological examination (general) (routine) without abnormal findings: Secondary | ICD-10-CM | POA: Insufficient documentation

## 2014-07-04 DIAGNOSIS — Z308 Encounter for other contraceptive management: Secondary | ICD-10-CM

## 2014-07-04 LAB — CBC WITH DIFFERENTIAL/PLATELET
Basophils Absolute: 0 K/uL (ref 0.0–0.1)
Basophils Relative: 0 % (ref 0–1)
Eosinophils Absolute: 0.1 K/uL (ref 0.0–0.7)
Eosinophils Relative: 1 % (ref 0–5)
HCT: 36.3 % (ref 36.0–46.0)
Hemoglobin: 12 g/dL (ref 12.0–15.0)
Lymphocytes Relative: 28 % (ref 12–46)
Lymphs Abs: 2.3 K/uL (ref 0.7–4.0)
MCH: 25.9 pg — ABNORMAL LOW (ref 26.0–34.0)
MCHC: 33.1 g/dL (ref 30.0–36.0)
MCV: 78.4 fL (ref 78.0–100.0)
MPV: 9.4 fL (ref 8.6–12.4)
Monocytes Absolute: 0.5 K/uL (ref 0.1–1.0)
Monocytes Relative: 6 % (ref 3–12)
Neutro Abs: 5.3 K/uL (ref 1.7–7.7)
Neutrophils Relative %: 65 % (ref 43–77)
Platelets: 364 K/uL (ref 150–400)
RBC: 4.63 MIL/uL (ref 3.87–5.11)
RDW: 14 % (ref 11.5–15.5)
WBC: 8.2 K/uL (ref 4.0–10.5)

## 2014-07-04 LAB — COMPREHENSIVE METABOLIC PANEL WITH GFR
ALT: 22 U/L (ref 0–35)
AST: 18 U/L (ref 0–37)
Albumin: 4 g/dL (ref 3.5–5.2)
Alkaline Phosphatase: 54 U/L (ref 39–117)
BUN: 9 mg/dL (ref 6–23)
CO2: 24 meq/L (ref 19–32)
Calcium: 9.2 mg/dL (ref 8.4–10.5)
Chloride: 105 meq/L (ref 96–112)
Creat: 0.98 mg/dL (ref 0.50–1.10)
Glucose, Bld: 106 mg/dL — ABNORMAL HIGH (ref 70–99)
Potassium: 3.9 meq/L (ref 3.5–5.3)
Sodium: 141 meq/L (ref 135–145)
Total Bilirubin: 0.2 mg/dL (ref 0.2–1.2)
Total Protein: 7.1 g/dL (ref 6.0–8.3)

## 2014-07-04 LAB — POCT WET PREP (WET MOUNT): CLUE CELLS WET PREP WHIFF POC: NEGATIVE

## 2014-07-04 NOTE — Assessment & Plan Note (Signed)
Scope used tubal ligation versus IUD.  She will call Medicaid and see if they will cover tubal ligation, and call me know if the referral needs to be made.  - Discussed the IUD but she is supposed at this time

## 2014-07-04 NOTE — Assessment & Plan Note (Signed)
Check wet prep, gonorrhea, chlamydia, HIV and RPR at patient requests

## 2014-07-04 NOTE — Progress Notes (Signed)
  Patient name: MYNESHA SEAMANS MRN [id number]  Date of birth: 1985-07-08  CC & HPI:  JOLIANA AHOLA is a 29 y.o. female presenting today for pap smear and discussion of pregnancy / STD testing.  She reports LMP 06/01/2014, and unprotected sex on 06/14/14.  Pertinent that her periods have been regular, she denies sex with the previous 2 months.  She reports positive home pregnancy test and desire to have an abortion.  [facility] is [facility] and has appointment scheduled for next week.  She would like testing for STDs.  She denies any vaginal pain, discharge, bleeding.  Denies any abdominal pain.  Denies any fevers or chills.  She also wishes to have tubal ligation or worsen.  She does not want anymore children.   ROS: See HPI   Medical & Surgical Hx:  Reviewed  Medications & Allergies: Reviewed  Social History: Reviewed:   Objective Findings:  Vitals: BP 129/65 mmHg  Pulse 80  Temp(Src) 98.3 F (36.8 C) (Oral)  Ht 5\' 5"  (1.651 m)  Wt 184 lb 12.8 oz (83.825 kg)  BMI 30.75 kg/m2  LMP 06/01/2014 (Exact Date)  Gen: NAD CV: RRR w/o m/r/g, pulses +2 b/l Resp: CTAB w/ normal respiratory effort GI: No skin changes; BS + x 4 quads; No tenderness or masses Speculum Exam: Ext genitalia: wnl; Vaginal discharge: thick white; Cervix: Friable with several small vesicles  Assessment & Plan:   Please See Problem Focused Assessment & Plan

## 2014-07-04 NOTE — Patient Instructions (Signed)
It was great seeing you today.    Called to me know if Medicaid will approve a tubal ligation,and I'll be happy to make a referral for you.   If you have any abdominal pain, or vaginal bleeding prior to your abortion call the clinic or go to ED if you feel lightheaded or dizzy.    Next Appointment  Please call to make an appointment with Dr Gayla Doss in 1-2 months.    I look forward to talking with you again at our next visit. If you have any questions or concerns before then, please call the clinic at [phone].  Take Care,   Dr Wenda Low

## 2014-07-04 NOTE — Assessment & Plan Note (Signed)
She plans to have abortions are scheduled visit with [facility] for next week.  - She will call Medicaid will cover dating ultrasound - Advised to call if she had any abdominal pain or vaginal bleeding prior to having an abortion completed

## 2014-07-04 NOTE — Addendum Note (Signed)
Addended by: Jennette Bill on: 07/04/2014 11:02 AM   Modules accepted: Orders

## 2014-07-04 NOTE — Assessment & Plan Note (Signed)
Check CBC 

## 2014-07-05 LAB — RPR TITER: RPR Titer: 1:2 {titer}

## 2014-07-05 LAB — HIV ANTIBODY (ROUTINE TESTING W REFLEX): HIV 1&2 Ab, 4th Generation: NONREACTIVE

## 2014-07-05 LAB — CYTOLOGY - PAP

## 2014-07-05 LAB — GC/CHLAMYDIA PROBE AMP (~~LOC~~) NOT AT ARMC
Chlamydia: NEGATIVE
Neisseria Gonorrhea: NEGATIVE

## 2014-07-05 LAB — RPR: RPR Ser Ql: REACTIVE — AB

## 2014-07-08 LAB — FLUORESCENT TREPONEMAL AB(FTA)-IGG-BLD: Fluorescent Treponemal ABS: NONREACTIVE

## 2014-07-09 ENCOUNTER — Encounter: Payer: Self-pay | Admitting: Family Medicine

## 2014-07-22 ENCOUNTER — Ambulatory Visit (INDEPENDENT_AMBULATORY_CARE_PROVIDER_SITE_OTHER): Payer: Medicaid Other | Admitting: Family Medicine

## 2014-07-22 ENCOUNTER — Encounter: Payer: Self-pay | Admitting: Family Medicine

## 2014-07-22 VITALS — BP 139/85 | HR 75 | Temp 98.4°F | Ht 65.0 in | Wt 190.0 lb

## 2014-07-22 DIAGNOSIS — Z30013 Encounter for initial prescription of injectable contraceptive: Secondary | ICD-10-CM | POA: Diagnosis not present

## 2014-07-22 LAB — POCT URINE PREGNANCY: Preg Test, Ur: NEGATIVE

## 2014-07-22 MED ORDER — MEDROXYPROGESTERONE ACETATE 150 MG/ML IM SUSP
150.0000 mg | Freq: Once | INTRAMUSCULAR | Status: AC
  Administered 2014-07-22: 150 mg via INTRAMUSCULAR

## 2014-07-22 NOTE — Addendum Note (Signed)
Addended by: Henri MedalHARTSELL, Sang Blount M on: 07/22/2014 04:22 PM   Modules accepted: Orders

## 2014-07-22 NOTE — Assessment & Plan Note (Signed)
Emergency test negative today.  Depo Shot given today - Advised taking prenatal vitamins, and using condoms for STD prevention

## 2014-07-22 NOTE — Progress Notes (Signed)
  Patient name: Amy [name] MRN [id number]  Date of birth: [birth year]  CC & HPI:  Amy [name] is a 29 y.o. female presenting today for birth control counseling.  She reports recent termination of pregnancy, and desire to start Depo shots again.  He reports tolerating Depakote well in the past with minimal bleeding, or weight gain.  She is supposed to IUD has Nexplanon at this time, and is aware that she still needs to use condoms for STD prevention.  He denies any current vaginal bleeding, discharge, itching, or pain.   ROS: See HPI  Medications & Allergies: Reviewed  Social History: Reviewed:   Objective Findings:  Vitals: BP 139/85 mmHg  Pulse 75  Temp(Src) 98.4 F (36.9 C) (Oral)  Ht 5\' 5"  (1.651 m)  Wt 190 lb (86.183 kg)  BMI 31.62 kg/m2  LMP 06/01/2014 (Exact Date)  Gen: NAD CV: RRR w/o m/r/g, pulses +2 b/l Resp: CTAB w/ normal respiratory effort  Assessment & Plan:   Please See Problem Focused Assessment & Plan

## 2014-07-31 ENCOUNTER — Emergency Department (HOSPITAL_COMMUNITY)
Admission: EM | Admit: 2014-07-31 | Discharge: 2014-07-31 | Disposition: A | Discharge status: Home / Self Care | Payer: No Typology Code available for payment source | Attending: Emergency Medicine | Admitting: Emergency Medicine

## 2014-07-31 ENCOUNTER — Encounter (HOSPITAL_COMMUNITY): Payer: Self-pay | Admitting: Emergency Medicine

## 2014-07-31 DIAGNOSIS — Y939 Activity, unspecified: Secondary | ICD-10-CM | POA: Diagnosis not present

## 2014-07-31 DIAGNOSIS — Z720 Tobacco use: Secondary | ICD-10-CM | POA: Diagnosis not present

## 2014-07-31 DIAGNOSIS — Z79899 Other long term (current) drug therapy: Secondary | ICD-10-CM | POA: Diagnosis not present

## 2014-07-31 DIAGNOSIS — S199XXA Unspecified injury of neck, initial encounter: Secondary | ICD-10-CM | POA: Diagnosis present

## 2014-07-31 DIAGNOSIS — S4992XA Unspecified injury of left shoulder and upper arm, initial encounter: Secondary | ICD-10-CM | POA: Insufficient documentation

## 2014-07-31 DIAGNOSIS — Y999 Unspecified external cause status: Secondary | ICD-10-CM | POA: Insufficient documentation

## 2014-07-31 DIAGNOSIS — Z8619 Personal history of other infectious and parasitic diseases: Secondary | ICD-10-CM | POA: Insufficient documentation

## 2014-07-31 DIAGNOSIS — S161XXA Strain of muscle, fascia and tendon at neck level, initial encounter: Secondary | ICD-10-CM | POA: Insufficient documentation

## 2014-07-31 DIAGNOSIS — Z792 Long term (current) use of antibiotics: Secondary | ICD-10-CM | POA: Insufficient documentation

## 2014-07-31 DIAGNOSIS — S4991XA Unspecified injury of right shoulder and upper arm, initial encounter: Secondary | ICD-10-CM | POA: Diagnosis not present

## 2014-07-31 DIAGNOSIS — Y92481 Parking lot as the place of occurrence of the external cause: Secondary | ICD-10-CM | POA: Insufficient documentation

## 2014-07-31 MED ORDER — IBUPROFEN 200 MG PO TABS
600.0000 mg | ORAL_TABLET | Freq: Once | ORAL | Status: AC
Start: 2014-07-31 — End: 2014-07-31
  Administered 2014-07-31: 600 mg via ORAL
  Filled 2014-07-31: qty 3

## 2014-07-31 MED ORDER — CYCLOBENZAPRINE HCL 10 MG PO TABS: 10.0000 mg | ORAL_TABLET | Freq: Two times a day (BID) | ORAL | Status: DC | PRN
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
## 2014-07-31 NOTE — Discharge Instructions (Signed)
If you were given medicines take as directed.  If you are on coumadin or contraceptives realize their levels and effectiveness is altered by many different medicines.  If you have any reaction (rash, tongues swelling, other) to the medicines stop taking and see a physician.    If your blood pressure was elevated in the ER make sure you follow up for management with a primary doctor or return for chest pain, shortness of breath or stroke symptoms.  Please follow up as directed and return to the ER or see a physician for new or worsening symptoms.  Thank you. Filed Vitals:   07/31/14 0700 07/31/14 0703 07/31/14 0715 07/31/14 0730  BP: 139/76 139/76 116/65 115/60  Pulse:  93 74 71  Temp:  98.5 F (36.9 C)    TempSrc:  Oral    Resp:  17    Height:  5\' 5"  (1.651 m)    Weight:  188 lb (85.276 kg)    SpO2:  99% 98% 97%    Cervical Sprain A cervical sprain is when the tissues (ligaments) that hold the neck bones in place stretch or tear. HOME CARE   Put ice on the injured area.  Put ice in a plastic bag.  Place a towel between your skin and the bag.  Leave the ice on for 15-20 minutes, 3-4 times a day.  You may have been given a collar to wear. This collar keeps your neck from moving while you heal.  Do not take the collar off unless told by your doctor.  If you have long hair, keep it outside of the collar.  Ask your doctor before changing the position of your collar. You may need to change its position over time to make it more comfortable.  If you are allowed to take off the collar for cleaning or bathing, follow your doctor's instructions on how to do it safely.  Keep your collar clean by wiping it with mild soap and water. Dry it completely. If the collar has removable pads, remove them every 1-2 days to hand wash them with soap and water. Allow them to air dry. They should be dry before you wear them in the collar.  Do not drive while wearing the collar.  Only take medicine  as told by your doctor.  Keep all doctor visits as told.  Keep all physical therapy visits as told.  Adjust your work station so that you have good posture while you work.  Avoid positions and activities that make your problems worse.  Warm up and stretch before being active. GET HELP IF:  Your pain is not controlled with medicine.  You cannot take less pain medicine over time as planned.  Your activity level does not improve as expected. GET HELP RIGHT AWAY IF:   You are bleeding.  Your stomach is upset.  You have an allergic reaction to your medicine.  You develop new problems that you cannot explain.  You lose feeling (become numb) or you cannot move any part of your body (paralysis).  You have tingling or weakness in any part of your body.  Your symptoms get worse. Symptoms include:  Pain, soreness, stiffness, puffiness (swelling), or a burning feeling in your neck.  Pain when your neck is touched.  Shoulder or upper back pain.  Limited ability to move your neck.  Headache.  Dizziness.  Your hands or arms feel week, lose feeling, or tingle.  Muscle spasms.  Difficulty swallowing or chewing. MAKE SURE  YOU:   Understand these instructions.  Will watch your condition.  Will get help right away if you are not doing well or get worse. Document Released: 06/16/2007 Document Revised: 08/30/2012 Document Reviewed: 07/05/2012 [facility] Patient Information 2015 [place], [place]. This information is not intended to replace advice given to you by your health care provider. Make sure you discuss any questions you have with your health care provider.
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
## 2014-09-06 ENCOUNTER — Emergency Department (HOSPITAL_COMMUNITY)
Admission: EM | Admit: 2014-09-06 | Discharge: 2014-09-07 | Disposition: A | Discharge status: Home / Self Care | Payer: Medicaid Other | Attending: Emergency Medicine | Admitting: Emergency Medicine

## 2014-09-06 ENCOUNTER — Encounter (HOSPITAL_COMMUNITY): Payer: Self-pay | Admitting: Emergency Medicine

## 2014-09-06 DIAGNOSIS — R071 Chest pain on breathing: Secondary | ICD-10-CM | POA: Insufficient documentation

## 2014-09-06 DIAGNOSIS — Z720 Tobacco use: Secondary | ICD-10-CM | POA: Insufficient documentation

## 2014-09-06 DIAGNOSIS — R0789 Other chest pain: Secondary | ICD-10-CM

## 2014-09-06 DIAGNOSIS — Z3202 Encounter for pregnancy test, result negative: Secondary | ICD-10-CM | POA: Insufficient documentation

## 2014-09-06 DIAGNOSIS — Z8619 Personal history of other infectious and parasitic diseases: Secondary | ICD-10-CM | POA: Insufficient documentation

## 2014-09-06 LAB — I-STAT TROPONIN, ED: Troponin i, poc: 0 ng/mL (ref 0.00–0.08)

## 2014-09-06 LAB — I-STAT BETA HCG BLOOD, ED (MC, WL, AP ONLY)

## 2014-09-06 NOTE — ED Notes (Signed)
Patient is complaining of chest pain on the right side and has gotten worse the last 2-3 days. Patient states that it hurts more when she turn her neck.

## 2014-09-06 NOTE — ED Provider Notes (Addendum)
CSN: [id number]     Arrival date & time 09/06/14  2107 History  This chart was scribed for Amy Severin, MD by Octavia Heir, ED Scribe. This patient was seen in room WA08/WA08 and the patient's care was started at 11:54 PM.    Chief Complaint  Patient presents with  . Chest Pain      The history is provided by the patient. No language interpreter was used.   HPI Comments: Amy [name] is a 29 y.o. female who presents to the Emergency Department complaining of intermittent, gradual worsening right sided chest pain onset 2 days ago. She notes her pain feels like an achy, cramping feeling and states the pain is worse when she moves her body. Pt states she took an 800 mg ibuprofen this morning with no relief. She is currently on the depo shot.  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 Pt denies shortness of breath, palpitations, heavy lifting, twisting, pain and swelling in legs.  Past Medical History  Diagnosis Date  . Chlamydia 2008  . Medical history non-contributory    Past Surgical History  Procedure Laterality Date  . No past surgeries     Family History  Problem Relation Age of Onset  . Cancer Maternal Grandfather 24  . Diabetes Neg Hx   . Hyperlipidemia Neg Hx   . Hypertension Neg Hx   . Other Neg Hx   . Cancer Maternal Grandmother     breast and cervical   Social History  Substance Use Topics  . Smoking status: Current Every Day Smoker -- 0.25 packs/day for 3 years    Types: Cigarettes  . Smokeless tobacco: Never Used     Comment: 4 cigs a day  . Alcohol Use: 0.6 oz/week    1 Cans of beer per week   OB History    Gravida Para Term Preterm AB TAB SAB Ectopic Multiple Living   8 4 4  0 4 4 0 0 0 4     Review of Systems  Respiratory: Negative for shortness of breath.   Cardiovascular: Positive for chest pain. Negative for palpitations.  Musculoskeletal: Negative for joint swelling.  All other systems reviewed and are  negative.     Allergies  Review of patient's allergies indicates no known allergies.  Home Medications   Prior to Admission medications   Medication Sig Start Date End Date Taking? Authorizing Provider  ibuprofen (ADVIL,MOTRIN) 200 MG tablet Take 800 mg by mouth every 6 (six) hours as needed for moderate pain.   Yes Historical Provider, MD  medroxyPROGESTERone (DEPO-PROVERA) 150 MG/ML injection Inject 150 mg into the muscle every 3 (three) months.   Yes Historical Provider, MD  acyclovir (ZOVIRAX) 400 MG tablet Take 1 tablet (400 mg total) by mouth 3 (three) times daily. Patient not taking: Reported on 09/06/2014 01/30/14   Duane Lope, NP  acyclovir (ZOVIRAX) 400 MG tablet Take 1 tablet (400 mg total) by mouth 3 (three) times daily. Patient not taking: Reported on 09/06/2014 02/02/14   Wilmer Floor Leftwich-Kirby, CNM  azithromycin (ZITHROMAX) 250 MG tablet Take 2 tablets on Day 1, then take 1 tablet daily. Patient not taking: Reported on 09/06/2014 02/02/14   Wilmer Floor Leftwich-Kirby, CNM  cyclobenzaprine (FLEXERIL) 10 MG tablet Take 1 tablet (10 mg total) by mouth 2 (two) times daily as needed for muscle spasms. Patient not taking: Reported on 09/06/2014 07/31/14   Blane Ohara, MD   Triage vitals: BP 139/78 mmHg  Pulse 79  Temp(Src) 98.5 F (36.9 C) (Oral)  Resp 18  Ht  (1.676 m)  Wt 180 lb (81.647 kg)  BMI 29.07 kg/m2  SpO2 100% Physical Exam  Constitutional: She is oriented to person, place, and time. She appears well-developed and well-nourished.  HENT:  Head: Normocephalic and atraumatic.  Nose: Nose normal.  Mouth/Throat: Oropharynx is clear and moist.  Eyes: Conjunctivae and EOM are normal. Pupils are equal, round, and reactive to light.  Neck: Normal range of motion. Neck supple. No JVD present. No tracheal deviation present. No thyromegaly present.  Cardiovascular: Normal rate, regular rhythm, normal heart sounds and intact distal pulses.  Exam reveals no gallop and no  friction rub.   No murmur heard. Pulmonary/Chest: Effort normal and breath sounds normal. No stridor. No respiratory distress. She has no wheezes. She has no rales. She exhibits tenderness (patient has tenderness along her right sternal margin.  Palpation of this area reproduces pain.  No step-off, crepitus.  No deformity.  No overlying skin changes).  Abdominal: Soft. Bowel sounds are normal. She exhibits no distension and no mass. There is no tenderness. There is no rebound and no guarding.  Musculoskeletal: Normal range of motion. She exhibits no edema or tenderness.  Lymphadenopathy:    She has no cervical adenopathy.  Neurological: She is alert and oriented to person, place, and time. She displays normal reflexes. She exhibits normal muscle tone. Coordination normal.  Skin: Skin is warm and dry. No rash noted. No erythema. No pallor.  Psychiatric: She has a normal mood and affect. Her behavior is normal. Judgment and thought content normal.  Nursing note and vitals reviewed.   ED Course  Procedures  DIAGNOSTIC STUDIES: Oxygen Saturation is 100% on RA, normal by my interpretation.  COORDINATION OF CARE:  11:59 PM Discussed treatment plan which includes anti-inflammatory with heat and ice with pt at bedside and pt agreed to plan.  Labs Review Labs Reviewed  I-STAT BETA HCG BLOOD, ED ([facility], [facility], [facility] ONLY)  I-STAT TROPOININ, ED    Imaging Review No results found. I have personally reviewed and evaluated these images and lab results as part of my medical decision-making.   EKG Interpretation   Date/Time:  Friday September 06 2014 21:23:00 EDT Ventricular Rate:  80 PR Interval:  150 QRS Duration: 77 QT Interval:  381 QTC Calculation: 439 R Axis:   67 Text Interpretation:  Sinus rhythm No old tracing to compare Confirmed by  [facility]  MD, ELLIOTT [phone]) on 09/06/2014 10:50:58 PM      MDM   Final diagnoses:  Costochondral chest pain    29 year old female with 3 days of  right-sided chest pain.  Pain is reproducible with palpation along the sternum.  Plan for treatment for costochondritis.  Doubt PE, pneumonia, ACS I personally performed the services described in this documentation, which was scribed in my presence. The recorded information has been reviewed and is accurate.    Amy Severin, MD 09/07/14 [id number]  Amy Severin, MD 09/07/14 [phone]

## 2014-09-07 MED ORDER — TRAMADOL HCL 50 MG PO TABS: 50.0000 mg | ORAL_TABLET | Freq: Four times a day (QID) | ORAL | Status: DC | PRN

## 2014-09-07 MED ORDER — NAPROXEN 500 MG PO TABS
500.0000 mg | ORAL_TABLET | Freq: Once | ORAL | Status: AC
  Administered 2014-09-07: 500 mg via ORAL
  Filled 2014-09-07: qty 1

## 2014-09-07 MED ORDER — TRAMADOL HCL 50 MG PO TABS
50.0000 mg | ORAL_TABLET | Freq: Once | ORAL | Status: AC
  Administered 2014-09-07: 50 mg via ORAL
  Filled 2014-09-07: qty 1

## 2014-09-07 MED ORDER — NAPROXEN 500 MG PO TABS: 500.0000 mg | ORAL_TABLET | Freq: Two times a day (BID) | ORAL | Status: DC

## 2014-09-07 NOTE — Discharge Instructions (Signed)
Take medications as prescribed.  Follow up with your doctor for recheck in 3-5 days. Return to the ER for worsening condition or new concerning symptoms.   Costochondritis Costochondritis, sometimes called Tietze syndrome, is a swelling and irritation (inflammation) of the tissue (cartilage) that connects your ribs with your breastbone (sternum). It causes pain in the chest and rib area. Costochondritis usually goes away on its own over time. It can take up to 6 weeks or longer to get better, especially if you are unable to limit your activities. CAUSES  Some cases of costochondritis have no known cause. Possible causes include:  Injury (trauma).  Exercise or activity such as lifting.  Severe coughing. SIGNS AND SYMPTOMS  Pain and tenderness in the chest and rib area.  Pain that gets worse when coughing or taking deep breaths.  Pain that gets worse with specific movements. DIAGNOSIS  Your health care provider will do a physical exam and ask about your symptoms. Chest X-rays or other tests may be done to rule out other problems. TREATMENT  Costochondritis usually goes away on its own over time. Your health care provider may prescribe medicine to help relieve pain. HOME CARE INSTRUCTIONS   Avoid exhausting physical activity. Try not to strain your ribs during normal activity. This would include any activities using chest, abdominal, and side muscles, especially if heavy weights are used.  Apply ice to the affected area for the first 2 days after the pain begins.  Put ice in a plastic bag.  Place a towel between your skin and the bag.  Leave the ice on for 20 minutes, 2-3 times a day.  Only take over-the-counter or prescription medicines as directed by your health care provider. SEEK MEDICAL CARE IF:  You have redness or swelling at the rib joints. These are signs of infection.  Your pain does not go away despite rest or medicine. SEEK IMMEDIATE MEDICAL CARE IF:   Your pain  increases or you are very uncomfortable.  You have shortness of breath or difficulty breathing.  You cough up blood.  You have worse chest pains, sweating, or vomiting.  You have a fever or persistent symptoms for more than 2-3 days.  You have a fever and your symptoms suddenly get worse. MAKE SURE YOU:   Understand these instructions.  Will watch your condition.  Will get help right away if you are not doing well or get worse. Document Released: 10/07/2004 Document Revised: 10/18/2012 Document Reviewed: 08/01/2012 [facility] Patient Information 2015 [place], [place]. This information is not intended to replace advice given to you by your health care provider. Make sure you discuss any questions you have with your health care provider.  Chest Wall Pain Chest wall pain is pain in or around the bones and muscles of your chest. It may take up to 6 weeks to get better. It may take longer if you must stay physically active in your work and activities.  CAUSES  Chest wall pain may happen on its own. However, it may be caused by:  A viral illness like the flu.  Injury.  Coughing.  Exercise.  Arthritis.  Fibromyalgia.  Shingles. HOME CARE INSTRUCTIONS   Avoid overtiring physical activity. Try not to strain or perform activities that cause pain. This includes any activities using your chest or your abdominal and side muscles, especially if heavy weights are used.  Put ice on the sore area.  Put ice in a plastic bag.  Place a towel between your skin and the  bag.  Leave the ice on for 15-20 minutes per hour while awake for the first 2 days.  Only take over-the-counter or prescription medicines for pain, discomfort, or fever as directed by your caregiver. SEEK IMMEDIATE MEDICAL CARE IF:   Your pain increases, or you are very uncomfortable.  You have a fever.  Your chest pain becomes worse.  You have new, unexplained symptoms.  You have nausea or vomiting.  You feel  sweaty or lightheaded.  You have a cough with phlegm (sputum), or you cough up blood. MAKE SURE YOU:   Understand these instructions.  Will watch your condition.  Will get help right away if you are not doing well or get worse. Document Released: 12/28/2004 Document Revised: 03/22/2011 Document Reviewed: 08/24/2010 [facility] Patient Information 2015 [place], [place]. This information is not intended to replace advice given to you by your health care provider. Make sure you discuss any questions you have with your health care provider.

## 2014-10-14 ENCOUNTER — Telehealth: Payer: Self-pay | Admitting: Family Medicine

## 2014-10-14 NOTE — Telephone Encounter (Signed)
Pt called and would like to speak to Dr. Gayla Doss about a personal matter. jw

## 2014-10-14 NOTE — Telephone Encounter (Signed)
Will forward to PCP for review. Narcissa Melder, CMA. 

## 2014-10-15 ENCOUNTER — Ambulatory Visit: Payer: Medicaid Other

## 2014-10-17 NOTE — Telephone Encounter (Signed)
I have attempted to contact her multiple times but have been unable to reach her. Left voicemail today asking her to call back and give reason for calling as this will expedite helping her and to provided best times and phone number to reach her.

## 2014-10-22 ENCOUNTER — Ambulatory Visit (INDEPENDENT_AMBULATORY_CARE_PROVIDER_SITE_OTHER): Payer: Medicaid Other | Admitting: *Deleted

## 2014-10-22 ENCOUNTER — Ambulatory Visit: Payer: Medicaid Other

## 2014-10-22 DIAGNOSIS — Z3042 Encounter for surveillance of injectable contraceptive: Secondary | ICD-10-CM | POA: Diagnosis present

## 2014-10-22 LAB — POCT URINE PREGNANCY: Preg Test, Ur: NEGATIVE

## 2014-10-22 MED ORDER — MEDROXYPROGESTERONE ACETATE 150 MG/ML IM SUSP
150.0000 mg | Freq: Once | INTRAMUSCULAR | Status: AC
  Administered 2014-10-22: 150 mg via INTRAMUSCULAR

## 2014-10-22 NOTE — Progress Notes (Signed)
   Pt one day late for Depo Provera injection.  Pregnancy test ordered; results negative.  Pt tolerated Depo injection. Depo given right upper outer quadrant per patient request.  Next injection due Dec. 27, 2016-Jan. 10, 2017.  Reminder card given. Clovis Pu, RN

## 2014-11-15 ENCOUNTER — Ambulatory Visit: Payer: Medicaid Other | Admitting: Family Medicine

## 2015-01-14 ENCOUNTER — Ambulatory Visit (INDEPENDENT_AMBULATORY_CARE_PROVIDER_SITE_OTHER): Payer: Medicaid Other | Admitting: *Deleted

## 2015-01-14 DIAGNOSIS — Z3049 Encounter for surveillance of other contraceptives: Secondary | ICD-10-CM

## 2015-01-14 DIAGNOSIS — Z3042 Encounter for surveillance of injectable contraceptive: Secondary | ICD-10-CM

## 2015-01-14 MED ORDER — MEDROXYPROGESTERONE ACETATE 150 MG/ML IM SUSP
150.0000 mg | Freq: Once | INTRAMUSCULAR | Status: AC
  Administered 2015-01-14: 150 mg via INTRAMUSCULAR

## 2015-01-14 NOTE — Progress Notes (Signed)
     Patient presents for Depo Provera injection States feeling well Last injection received 10/22/2014 Patient prefers to use right side only Depo Provera given IM RUOQ. Patient tolerated well. Next injection due 3/21-04/15/2015 Reminder card given Fredderick SeveranceUCATTE, LAURENZE L, RN

## 2015-02-14 ENCOUNTER — Ambulatory Visit (INDEPENDENT_AMBULATORY_CARE_PROVIDER_SITE_OTHER): Payer: Medicaid Other | Admitting: Family Medicine

## 2015-02-14 ENCOUNTER — Encounter: Payer: Self-pay | Admitting: Family Medicine

## 2015-02-14 VITALS — BP 139/83 | HR 92 | Temp 98.3°F | Wt 193.2 lb

## 2015-02-14 DIAGNOSIS — Z113 Encounter for screening for infections with a predominantly sexual mode of transmission: Secondary | ICD-10-CM

## 2015-02-14 DIAGNOSIS — R61 Generalized hyperhidrosis: Secondary | ICD-10-CM | POA: Diagnosis not present

## 2015-02-14 NOTE — Assessment & Plan Note (Signed)
Reports night sweats for the past several months without additional symptoms: no nausea, anorexia, history of cancer, fevers or recent infections.  - Check CBC, CMP in addition to STD labs - Recommend a follow-up in 1-2 weeks for breast exam given her symptoms of right breast pain and history of breast cancer in her mother in her 72's

## 2015-02-14 NOTE — Assessment & Plan Note (Addendum)
Future labs for HIV, RPR, gonorrhea and chlamydia. 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

## 2015-02-14 NOTE — Progress Notes (Signed)
  Patient name: Amy [name] MRN [id number]  Date of birth: May 23, 1985  CC & HPI:  Amy [name] is a 30 y.o. female presenting today for comes in today for STD testing as well as night sweats. She denies any vaginal lesions, discharge, pain or itching. She's not had a menstrual cycle for several months while she has been on Depo birth control shots. She has been sexually active without condom use and requests testing for STDs. Additionally, she reports having night sweats for the past several months. She denies any chest pain, shortness of breath, cough, recent illnesses, medications other than Depo birth control and vitamins. She denies being in contact with anyone with tuberculosis. She does endorse right breast pain and is concerned because her mother developed breast cancer in her 47s. She denies any anorexia or weight loss.   ROS: See HPI   Medical & Surgical Hx:  Reviewed  Medications & Allergies: Reviewed  Social History: Reviewed:   Objective Findings:  Vitals: BP 139/83 mmHg  Pulse 92  Temp(Src) 98.3 F (36.8 C) (Oral)  Wt 193 lb 3.2 oz (87.635 kg)  Gen: NAD CV: RRR w/o m/r/g, pulses +2 b/l Resp: CTAB w/ normal respiratory effort  Assessment & Plan:   Screen for STD (sexually transmitted disease) Future labs for HIV, RPR, gonorrhea and chlamydia. 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  - Check CBC, CMP in addition to STD labs - Recommend a follow-up in 1-2 weeks for breast exam given her symptoms of right breast pain and history of breast cancer in her mother in her 32's

## 2015-02-18 ENCOUNTER — Other Ambulatory Visit: Payer: Medicaid Other

## 2015-02-18 LAB — CBC WITH DIFFERENTIAL/PLATELET
BASOS PCT: 1 % (ref 0–1)
Basophils Absolute: 0.1 10*3/uL (ref 0.0–0.1)
EOS ABS: 0.1 10*3/uL (ref 0.0–0.7)
Eosinophils Relative: 1 % (ref 0–5)
HCT: 38 % (ref 36.0–46.0)
Hemoglobin: 12.4 g/dL (ref 12.0–15.0)
LYMPHS ABS: 2.5 10*3/uL (ref 0.7–4.0)
Lymphocytes Relative: 30 % (ref 12–46)
MCH: 25.3 pg — ABNORMAL LOW (ref 26.0–34.0)
MCHC: 32.6 g/dL (ref 30.0–36.0)
MCV: 77.6 fL — ABNORMAL LOW (ref 78.0–100.0)
MPV: 9 fL (ref 8.6–12.4)
Monocytes Absolute: 0.5 10*3/uL (ref 0.1–1.0)
Monocytes Relative: 6 % (ref 3–12)
NEUTROS ABS: 5.1 10*3/uL (ref 1.7–7.7)
NEUTROS PCT: 62 % (ref 43–77)
PLATELETS: 404 10*3/uL — AB (ref 150–400)
RBC: 4.9 MIL/uL (ref 3.87–5.11)
RDW: 13.8 % (ref 11.5–15.5)
WBC: 8.3 10*3/uL (ref 4.0–10.5)

## 2015-02-18 LAB — TSH: TSH: 0.91 m[IU]/L

## 2015-02-18 NOTE — Progress Notes (Signed)
Labs done today Amy [name] 

## 2015-02-19 LAB — COMPLETE METABOLIC PANEL WITH GFR
ALK PHOS: 60 U/L (ref 33–115)
ALT: 21 U/L (ref 6–29)
AST: 17 U/L (ref 10–30)
Albumin: 4.4 g/dL (ref 3.6–5.1)
BUN: 11 mg/dL (ref 7–25)
CO2: 24 mmol/L (ref 20–31)
Calcium: 9.2 mg/dL (ref 8.6–10.2)
Chloride: 107 mmol/L (ref 98–110)
Creat: 0.78 mg/dL (ref 0.50–1.10)
GLUCOSE: 88 mg/dL (ref 65–99)
POTASSIUM: 4.2 mmol/L (ref 3.5–5.3)
SODIUM: 141 mmol/L (ref 135–146)
Total Bilirubin: 0.4 mg/dL (ref 0.2–1.2)
Total Protein: 7.2 g/dL (ref 6.1–8.1)

## 2015-02-19 LAB — HIV ANTIBODY (ROUTINE TESTING W REFLEX): HIV 1&2 Ab, 4th Generation: NONREACTIVE

## 2015-02-19 LAB — RPR: RPR Ser Ql: REACTIVE — AB

## 2015-02-19 LAB — RPR TITER: RPR Titer: 1:2 {titer}

## 2015-02-21 LAB — FLUORESCENT TREPONEMAL AB(FTA)-IGG-BLD: FLUORESCENT TREPONEMAL ABS: NONREACTIVE

## 2015-02-25 ENCOUNTER — Encounter: Payer: Self-pay | Admitting: Family Medicine

## 2015-04-01 ENCOUNTER — Ambulatory Visit: Payer: Medicaid Other

## 2015-05-27 ENCOUNTER — Ambulatory Visit (HOSPITAL_COMMUNITY)
Admission: EM | Admit: 2015-05-27 | Discharge: 2015-05-27 | Disposition: A | Discharge status: Home / Self Care | Payer: No Typology Code available for payment source | Attending: Family Medicine | Admitting: Family Medicine

## 2015-05-27 ENCOUNTER — Encounter (HOSPITAL_COMMUNITY): Payer: Self-pay | Admitting: Emergency Medicine

## 2015-05-27 DIAGNOSIS — R0682 Tachypnea, not elsewhere classified: Secondary | ICD-10-CM

## 2015-05-27 DIAGNOSIS — Z9889 Other specified postprocedural states: Secondary | ICD-10-CM

## 2015-05-27 DIAGNOSIS — T8131XA Disruption of external operation (surgical) wound, not elsewhere classified, initial encounter: Secondary | ICD-10-CM

## 2015-05-27 DIAGNOSIS — R600 Localized edema: Secondary | ICD-10-CM

## 2015-05-27 MED ORDER — CEPHALEXIN 500 MG PO CAPS: 500.0000 mg | ORAL_CAPSULE | Freq: Four times a day (QID) | ORAL | Status: DC

## 2015-05-27 NOTE — ED Notes (Signed)
Patient reports she had "tummy tuck" and "[place]" [place] the [place] 2 [place] ago.  [place].  Patient has an area of concern near her incision.  Tissue above center incision is dark and peeling, slight oozing of bloody discharge.  Patient's abdomen is hard.  Patient speaking in complete sentences, about appears to be panting.  Asked patient about this and her reply was she is so tight, gesturing to abdomen.  Patient has pedal edema that started Friday 5/12.

## 2015-05-27 NOTE — Discharge Instructions (Signed)
Call today to get an appointment with your primary care doctor. If you have any worsening of symptoms such as shortness of breath, chest pain, sweating, hard to breathe, cough, fever, abdominal pain or other new symptoms go directly to the emergency department.  Wound Dehiscence Wound dehiscence is when a surgical cut (incision) breaks open and does not heal properly after surgery. It usually happens 7-10 days after surgery. This can be a serious condition. It is important to identify and treat this condition early.  CAUSES  Some common causes of wound dehiscence include:  Stretching of the wound area. This may be caused by lifting, vomiting, violent coughing, or straining during bowel movements.  Wound infection.  Early stitch (suture) removal. RISK FACTORS Various things can increase your risk of developing wound dehiscence, including:  Obesity.  Lung disease.  Smoking.  Poor nutrition.  Contamination during surgery. SIGNS AND SYMPTOMS  Bleeding from the wound.  Pain.  Fever.  Wound starts breaking open. DIAGNOSIS  Your health care provider may diagnose wound dehiscence by monitoring the incision and noting any changes in the wound. These changes can include an increase in drainage or pain. The health care provider may also ask you if you have noticed any stretching or tearing of the wound.  Wound cultures may be taken to determine if there is an infection.  Imaging studies, such as an MRI scan or CT scan, may be done to determine if there is a collection of pus or fluid in the wound area. TREATMENT Treatment may include:  Wound care.  Surgical repair.  Antibiotic medicine to treat or prevent infection.  Medicines to reduce pain and swelling. HOME CARE INSTRUCTIONS   Only take over-the-counter or prescription medicines for pain, discomfort, or fever as directed by your health care provider. Taking pain medicine 30 minutes before changing a bandage (dressing)  can help relieve pain.  Take your antibiotics as directed. Finish them even if you start to feel better.  Gently wash the area with mild soap and water 2 times a day, or as directed. Rinse off the soap. Pat the area dry with a clean towel. Do not rub the wound. This may cause bleeding.  Follow your health care provider's instructions for how often you need to change the dressing and packing inside. Wash your hands well before and after changing your dressing. Apply a dressing to the wound as directed.  Take showers. Do not soak the wound, bathe, swim, or use a hot tub until directed by your health care provider.  Avoid exercises that make you sweat heavily.  Use anti-itch medicine as directed by your health care provider. The wound may itch when it is healing. Do not pick or scratch at the wound.  Do not lift more than 10 pounds (4.5 kg) until the wound is healed, or as directed by your health care provider.  Keep all follow-up appointments as directed. SEEK MEDICAL CARE IF:  You have excessive bleeding from your surgical wound.  Your wound does not seem to be healing properly.  You have a fever. SEEK IMMEDIATE MEDICAL CARE IF:   You have increased swelling or redness around the wound.  You have increasing pain in the wound.  You have an increasing amount of pus coming from the wound.  Your wound breaks open farther. MAKE SURE YOU:   Understand these instructions.  Will watch your condition.  Will get help right away if you are not doing well or get worse.  This information is not intended to replace advice given to you by your health care provider. Make sure you discuss any questions you have with your health care provider.   Document Released: 03/20/2003 Document Revised: 01/02/2013 Document Reviewed: 09/04/2012 Elsevier Interactive Patient Education 2016 Elsevier Inc.  Peripheral Edema You have swelling in your legs (peripheral edema). This swelling is due to  excess accumulation of salt and water in your body. Edema may be a sign of heart, kidney or liver disease, or a side effect of a medication. It may also be due to problems in the leg veins. Elevating your legs and using special support stockings may be very helpful, if the cause of the swelling is due to poor venous circulation. Avoid long periods of standing, whatever the cause. Treatment of edema depends on identifying the cause. Chips, pretzels, pickles and other salty foods should be avoided. Restricting salt in your diet is almost always needed. Water pills (diuretics) are often used to remove the excess salt and water from your body via urine. These medicines prevent the kidney from reabsorbing sodium. This increases urine flow. Diuretic treatment may also result in lowering of potassium levels in your body. Potassium supplements may be needed if you have to use diuretics daily. Daily weights can help you keep track of your progress in clearing your edema. You should call your caregiver for follow up care as recommended. SEEK IMMEDIATE MEDICAL CARE IF:   You have increased swelling, pain, redness, or heat in your legs.  You develop shortness of breath, especially when lying down.  You develop chest or abdominal pain, weakness, or fainting.  You have a fever.   This information is not intended to replace advice given to you by your health care provider. Make sure you discuss any questions you have with your health care provider.   Document Released: 02/05/2004 Document Revised: 03/22/2011 Document Reviewed: 07/10/2014 Elsevier Interactive Patient Education [employer] Elsevier Inc.

## 2015-05-27 NOTE — ED Provider Notes (Signed)
CSN: [id number]     Arrival date & time 05/27/15  1321 History   First MD Initiated Contact with Patient 05/27/15 1510     Chief Complaint  Patient presents with  . Wound Infection  . Foot Swelling   (Consider location/radiation/quality/duration/timing/severity/associated sxs/prior Treatment) HPI Comments: 30 year old female states that she had surgery in the [place] little over 2 weeks ago. She states that she had a "tummy tuck" utilizing lipase section involving the abdomen. The mid surgical line was not completely sutured. She was concerned about a superficial dehiscence at that point. She has had no associated drainage or bleeding. She had taken a picture and sent it to her surgeon in the [place] and was told to put a specific cream on it. Last night she took photographs and there was minor surrounding erythema. That is not as visible today. She is not complaining of abdominal pain, nausea or vomiting. She is concerned about edema in the lower extremities. It is also noted that although in no respiratory distress she has a respiratory rate of 28. Patient denies dyspnea. Denies fever, chills. States she is having normal bowel movements. No problems with urination or pelvic pain.   Past Medical History  Diagnosis Date  . Chlamydia 2008  . Medical history non-contributory    Past Surgical History  Procedure Laterality Date  . No past surgeries    . Tummy tuck    . Liposuction     Family History  Problem Relation Age of Onset  . Cancer Maternal Grandfather 3961  . Diabetes Neg Hx   . Hyperlipidemia Neg Hx   . Hypertension Neg Hx   . Other Neg Hx   . Cancer Maternal Grandmother     breast and cervical   Social History  Substance Use Topics  . Smoking status: Current Every Day Smoker -- 0.25 packs/day for 3 years    Types: Cigarettes  . Smokeless tobacco: Never Used     Comment: 4 cigs a day  . Alcohol Use: 0.6 oz/week    1 Cans of beer per week   OB  History    Gravida Para Term Preterm AB TAB SAB Ectopic Multiple Living   8 4 4  0 4 4 0 0 0 4     Review of Systems  Constitutional: Negative for fever, chills, activity change and fatigue.  HENT: Negative.   Respiratory: Negative.  Negative for chest tightness and shortness of breath.   Cardiovascular: Positive for leg swelling. Negative for chest pain and palpitations.  Gastrointestinal: Negative for nausea, vomiting, abdominal pain, constipation and blood in stool.  Genitourinary: Negative.  Negative for dysuria, urgency, vaginal bleeding, vaginal discharge, vaginal pain and pelvic pain.  Musculoskeletal: Negative.   Skin: Positive for wound.  Neurological: Negative.   Psychiatric/Behavioral: Negative.     Allergies  Review of patient's allergies indicates no known allergies.  Home Medications   Prior to Admission medications   Medication Sig Start Date End Date Taking? Authorizing Provider  cephALEXin (KEFLEX) 500 MG capsule Take 1 capsule (500 mg total) by mouth 4 (four) times daily. 05/27/15   Hayden Rasmussenavid Heavenlee Maiorana, NP  medroxyPROGESTERone (DEPO-PROVERA) 150 MG/ML injection Inject 150 mg into the muscle every 3 (three) months.    Historical Provider, MD   Meds Ordered and Administered this Visit  Medications - No data to display  BP 135/69 mmHg  Pulse 89  Temp(Src) 99.6 F (37.6 C) (Oral)  Resp 28  SpO2 99% No data found.  Physical Exam  Constitutional: She is oriented to person, place, and time. She appears well-developed and well-nourished. No distress.  The patient is fully awake and alert and oriented. She has relaxed posturing and speech. Speech is clear. She appears to be in no distress. Respirations are even and nonlabored initially appeared to be a little rapid at 28., At time of Discharge  respirations 22 to 24.   HENT:  Mouth/Throat: Oropharynx is clear and moist. No oropharyngeal exudate.  Eyes: Conjunctivae and EOM are normal.  Neck: Normal range of motion. Neck  supple.  Cardiovascular: Normal rate, regular rhythm, normal heart sounds and intact distal pulses.   Pulmonary/Chest: Effort normal and breath sounds normal. No respiratory distress. She has no wheezes. She has no rales. She exhibits no tenderness.  Abdominal: Soft. Bowel sounds are normal. She exhibits no distension.  Very minor tenderness over the entire abdomen. The abdomen is firm and indurated diffusely and evenly. There is scattered ecchymosis.  The OpSite and wished the patient was concerned is approximately 2 cm in length. It is healing by secondary intention. No drainage or purulence. The wound is dry. There is very minor superficial cutaneous erythema extending approximately one and half centimeters around the wound. No swelling or puffiness around the wound.  Musculoskeletal: Normal range of motion. She exhibits edema. She exhibits no tenderness.  2+ pitting edema in the lower extremities approximately halfway between the knee and ankle extending into the feet. No discoloration. Normal warmth and color.  Lymphadenopathy:    She has no cervical adenopathy.  Neurological: She is alert and oriented to person, place, and time. She exhibits normal muscle tone.  Skin: Skin is warm and dry. No rash noted.  Psychiatric: She has a normal mood and affect.  Nursing note and vitals reviewed.   ED Course  Procedures (including critical care time)  Labs Review Labs Reviewed - No data to display  Imaging Review No results found.   Visual Acuity Review  Right Eye Distance:   Left Eye Distance:   Bilateral Distance:    Right Eye Near:   Left Eye Near:    Bilateral Near:         MDM   1. Post-operative state   2. Wound dehiscence, initial encounter   3. Bilateral edema of lower extremity   4. Tachypnea    Call today to get an appointment with your primary care doctor. If you have any worsening of symptoms such as shortness of breath, chest pain, sweating, hard to breathe,  cough, fever, abdominal pain or other new symptoms go directly to the emergency department.  Meds ordered this encounter  Medications  . cephALEXin (KEFLEX) 500 MG capsule    Sig: Take 1 capsule (500 mg total) by mouth 4 (four) times daily.    Dispense:  28 capsule    Refill:  0    Order Specific Question:  Supervising Provider    Answer:  Linna Hoff [[id number]]   Red flags in regards to symptoms discussed in detail with the patient. On discharge her temperature is 99.1 degrees. She is having no respiratory symptoms. Resp even and nonlabored. Respiratory rate is 24. No chest pain or discomfort of any type, no abdominal pain. She has relaxed posturing and showing no signs of distress. Saturation 99%. Pulse rate 90. To follow-up with PCP as soon as possible. To go to the emergency department as instructed above. Dr. Benna Dunks the plastic surgeon was consulted in regards to expected postop recovery,  signs and symptoms. He was very helpful in describing those symptoms which are normal and there is a concern. He states that the symptoms described him in regards to the diffuse even induration along with the ecchymosis over the entire abdomen is to be expected. The lower extremity edema is also explained by the disruption and lymphatic flow secondary to the trauma of the surgery.     Hayden Rasmussen, NP 05/27/15 2124

## 2015-06-11 ENCOUNTER — Telehealth: Payer: Self-pay | Admitting: *Deleted

## 2015-06-11 ENCOUNTER — Ambulatory Visit (INDEPENDENT_AMBULATORY_CARE_PROVIDER_SITE_OTHER): Payer: Medicaid Other | Admitting: Family Medicine

## 2015-06-11 ENCOUNTER — Encounter: Payer: Self-pay | Admitting: Family Medicine

## 2015-06-11 VITALS — BP 125/81 | HR 83 | Temp 98.2°F | Wt 177.4 lb

## 2015-06-11 DIAGNOSIS — Z3042 Encounter for surveillance of injectable contraceptive: Secondary | ICD-10-CM

## 2015-06-11 DIAGNOSIS — Z30013 Encounter for initial prescription of injectable contraceptive: Secondary | ICD-10-CM | POA: Diagnosis not present

## 2015-06-11 DIAGNOSIS — T8189XA Other complications of procedures, not elsewhere classified, initial encounter: Secondary | ICD-10-CM | POA: Insufficient documentation

## 2015-06-11 DIAGNOSIS — Z308 Encounter for other contraceptive management: Secondary | ICD-10-CM

## 2015-06-11 LAB — POCT URINE PREGNANCY: Preg Test, Ur: NEGATIVE

## 2015-06-11 MED ORDER — MEDROXYPROGESTERONE ACETATE 150 MG/ML IM SUSP
150.0000 mg | Freq: Once | INTRAMUSCULAR | Status: AC
  Administered 2015-06-11: 150 mg via INTRAMUSCULAR

## 2015-06-11 NOTE — Telephone Encounter (Signed)
Patient already called and advised this. Since patient is self pay, she should be able to call any plastic surgery office to schedule a consultation. I [facility] suggest [facility] on [address]

## 2015-06-11 NOTE — Progress Notes (Signed)
  Patient name: Amy [name] Rathgeber MRN [id number]  Date of birth: 06-Nov-1985  CC & HPI:  Amy [name] Corea is a 30 y.o. female presenting today for surgical wound. She reports having liposuction and tummy tuck performed and [place] on 05/13/15.  She reports slowly worsening that her surgical site since returning.  Was seen in urgent care recently and treated with Keflex, which she completed today.  She denies any fevers, chills, abdominal pain, redness or swelling around the wound.  Reports numbness around surgical wound.   Smoking History Noted  Objective Findings:  Vitals: BP 125/81 mmHg  Pulse 83  Temp(Src) 98.2 F (36.8 C) (Oral)  Wt 177 lb 6.4 oz (80.468 kg)  Gen: NAD CV: RRR w/o [name]/r/g, pulses +2 b/l Resp: CTAB w/ normal respiratory effort Skin: Approximately 4 cm x 2 cm necrotic wound at site of surgical incision with overlying eschar; & 1cm wound at the umbilicus.  No surrounding erythema or warmth.   Assessment & Plan:   Non-healing surgical wound Enlarging unstagable wound at surgical incision with overlying eschar.  No current signs of infection - Refer to general surgery the next 24-48 hours for debridement - Advise return precautions as develops fever, chills, abdominal pain or redness, swelling around wound  Contraception Urine pregnancy negative - Restart Depo-Provera

## 2015-06-11 NOTE — Assessment & Plan Note (Signed)
Enlarging unstagable wound at surgical incision with overlying eschar.  No current signs of infection - Refer to general surgery the next 24-48 hours for debridement - Advise return precautions as develops fever, chills, abdominal pain or redness, swelling around wound

## 2015-06-11 NOTE — Assessment & Plan Note (Signed)
Urine pregnancy negative - Restart Depo-Provera

## 2015-06-11 NOTE — Telephone Encounter (Signed)
Will forward to MD to advise. Temekia Caskey,CMA  

## 2015-06-11 NOTE — Patient Instructions (Signed)
I have referred you to surgery for evaluation and treatment of your surgical wound.  - Continue to keep the area clean and dry with soap and water - Call if you develop any fevers, chills, abdominal pain or redness / warmth surrounding wound

## 2015-06-11 NOTE — Telephone Encounter (Signed)
Amy [name], from [facility] called stating that they can't perform the procedure for the patient that office.  Patient will need to be seen by a plastic surgery.  Patient is aware and looking for a call from our practice for the new referral from the plastic surgeon office.  Clovis PuMartin, Adrienne Trombetta L, RN

## 2015-06-11 NOTE — Telephone Encounter (Signed)
Called patient.  She reports being told by general surgery that she may be better served with wound care clinic or plastic surgery.  She called the wound care clinic and has an appointment for 06/20/15.  She does work full time.  - I provided her with phone numbers and will address to apply for for the affordable healthcare.  - Advised her to call the clinic and make apt to apply for [facility] card if she was unable to afford health insurance - In the meantime, she will call plastic surgery, dermatology clinics in the area and see if she will be able to get a sooner appointment than June 9 - Advise return to clinic if she develops any fever, chills, abdominal pain, redness, warmth or swelling around the wound

## 2015-06-11 NOTE — Telephone Encounter (Signed)
Pt spoke with surgical center dr Gayla Dossjoyner referred her to and they called her back saying the were unable to do the procedure. Wants to know what to do next. Please advise. Amy [name], CMA

## 2015-06-14 LAB — WOUND CULTURE: GRAM STAIN: NONE SEEN

## 2015-06-20 ENCOUNTER — Encounter (HOSPITAL_BASED_OUTPATIENT_CLINIC_OR_DEPARTMENT_OTHER): Payer: Medicaid Other

## 2015-07-07 ENCOUNTER — Telehealth: Payer: Self-pay | Admitting: Family Medicine

## 2015-07-07 NOTE — Telephone Encounter (Signed)
Called and discuss culture results.  She has established with a dermatologist and has had the wound debrided.  She denies any fevers, chills, drainage, erythema, or abdominal pain.  She has follow-up with dermatologist scheduled, will call us if she develops any signs of infection.

## 2015-09-08 ENCOUNTER — Ambulatory Visit: Payer: Medicaid Other | Admitting: Internal Medicine

## 2015-10-29 ENCOUNTER — Ambulatory Visit (INDEPENDENT_AMBULATORY_CARE_PROVIDER_SITE_OTHER): Payer: Medicaid Other | Admitting: *Deleted

## 2015-10-29 DIAGNOSIS — Z3040 Encounter for surveillance of contraceptives, unspecified: Secondary | ICD-10-CM

## 2015-10-29 DIAGNOSIS — Z3042 Encounter for surveillance of injectable contraceptive: Secondary | ICD-10-CM

## 2015-10-29 LAB — POCT URINE PREGNANCY: Preg Test, Ur: NEGATIVE

## 2015-10-29 MED ORDER — MEDROXYPROGESTERONE ACETATE 150 MG/ML IM SUSP
150.0000 mg | Freq: Once | INTRAMUSCULAR | Status: AC
  Administered 2015-10-29: 150 mg via INTRAMUSCULAR

## 2015-10-29 NOTE — Progress Notes (Signed)
   Pt late for Depo Provera injection.  Pregnancy test ordered; result negative. Advised patient to take another pregnancy test in the week or two.  Also, to use another reliable form of birth control for the next 7-10 days.  Pt tolerated Depo injection. Depo given right upper outer quadrant.  Next injection due Jan. 3-17, 2018.  Reminder card given. Clovis PuMartin, Tamika L, RN

## 2015-10-30 ENCOUNTER — Ambulatory Visit: Payer: Medicaid Other

## 2016-02-23 ENCOUNTER — Ambulatory Visit (INDEPENDENT_AMBULATORY_CARE_PROVIDER_SITE_OTHER): Payer: Medicaid Other | Admitting: Internal Medicine

## 2016-02-23 ENCOUNTER — Encounter: Payer: Self-pay | Admitting: Internal Medicine

## 2016-02-23 ENCOUNTER — Other Ambulatory Visit (HOSPITAL_COMMUNITY)
Admission: RE | Admit: 2016-02-23 | Discharge: 2016-02-23 | Disposition: A | Discharge status: Home / Self Care | Payer: Medicaid Other | Source: Ambulatory Visit | Attending: Family Medicine | Admitting: Family Medicine

## 2016-02-23 VITALS — BP 104/64 | HR 93 | Temp 98.4°F | Wt 185.0 lb

## 2016-02-23 DIAGNOSIS — Z3040 Encounter for surveillance of contraceptives, unspecified: Secondary | ICD-10-CM

## 2016-02-23 DIAGNOSIS — Z113 Encounter for screening for infections with a predominantly sexual mode of transmission: Secondary | ICD-10-CM | POA: Diagnosis present

## 2016-02-23 DIAGNOSIS — Z3042 Encounter for surveillance of injectable contraceptive: Secondary | ICD-10-CM

## 2016-02-23 MED ORDER — MEDROXYPROGESTERONE ACETATE 150 MG/ML IM SUSP
150.0000 mg | Freq: Once | INTRAMUSCULAR | Status: AC
  Administered 2016-02-23: 150 mg via INTRAMUSCULAR

## 2016-02-23 NOTE — Assessment & Plan Note (Signed)
-   HIV, RPR, gonorrhea, chlamydia, trichomonas, and urine pregnancy ordered

## 2016-02-23 NOTE — Progress Notes (Signed)
31 y.o. year old female presents for well woman/preventative visit and annual GYN examination.  Acute Concerns: Patient would like depo today  Diet: Trying to eat better. Has cut back on sodas. She loves fruits and vegetables.   Exercise: Pt is planning on starting to work out. Just recently joined a gym  Sexual/Birth History: Sexually active. 2 female partners in the last year.  Birth Control: Depo   Social:  Social History   Social History  . Marital status: Single    Spouse name: N/A  . Number of children: N/A  . Years of education: 10   Occupational History  . [employer]   Social History Main Topics  . Smoking status: Never Smoker  . Smokeless tobacco: Never Used     Comment: 4 cigs a day  . Alcohol use 0.6 oz/week    1 Cans of beer per week  . Drug use: No  . Sexual activity: Yes    Birth control/ protection: None   Other Topics Concern  . None   Social History Narrative  . None    Immunization:   There is no immunization history on file for this patient.  Cancer Screening:  Pap Smear: Will be due for pap in 2019   Physical Exam: VITALS: Reviewed GEN: Pleasant female, NAD HEENT: Normocephalic, PERRL, EOMI, no scleral icterus, MMM, uvula midline, no anterior or posterior lymphadenopathy, no thyromegaly CARDIAC:RRR, S1 and S2 present, no murmur, no heaves/thrills RESP: CTAB, normal effort ABD: soft, no tenderness, normal bowel sounds EXT: No edema, 2+ radial and DP pulses SKIN: no rash  ASSESSMENT & PLAN: 31 y.o. female presents for annual well woman/preventative exam and GYN exam. Please see problem specific assessment and plan.   Encounter for Birth Control Counseling: Discussed different birth control options - Pt would like to receive Depo shot today  STD Screening - HIV, RPR, gonorrhea, chlamydia, trichomonas, and urine pregnancy ordered

## 2016-02-23 NOTE — Assessment & Plan Note (Signed)
Discussed different birth control options - Pt would like to receive Depo shot today

## 2016-02-23 NOTE — Patient Instructions (Signed)
It was so nice to meet you!  I will call you with your lab results.  -Dr. Hiroki Wint 

## 2016-02-24 LAB — HIV ANTIBODY (ROUTINE TESTING W REFLEX): HIV 1&2 Ab, 4th Generation: NONREACTIVE

## 2016-02-24 LAB — POCT URINE PREGNANCY: PREG TEST UR: NEGATIVE

## 2016-02-24 LAB — URINE CYTOLOGY ANCILLARY ONLY
CHLAMYDIA, DNA PROBE: NEGATIVE
NEISSERIA GONORRHEA: NEGATIVE
Trichomonas: NEGATIVE

## 2016-02-24 LAB — RPR: RPR Ser Ql: REACTIVE — AB

## 2016-02-24 LAB — RPR TITER

## 2016-02-25 LAB — FLUORESCENT TREPONEMAL AB(FTA)-IGG-BLD: FLUORESCENT TREPONEMAL ABS: NONREACTIVE

## 2016-02-26 ENCOUNTER — Telehealth: Payer: Self-pay | Admitting: *Deleted

## 2016-02-26 NOTE — Telephone Encounter (Signed)
LM for patient to call back. Please inform her that labs are normal.  Letter sent through mychart.  Jazmin Hartsell,CMA

## 2016-02-26 NOTE — Telephone Encounter (Signed)
-----   Message from Campbell StallKaty Dodd Mayo, MD sent at 02/25/2016  2:55 PM EST ----- Please let Ms. Amy [name] know that her STD testing was negative.

## 2016-05-28 ENCOUNTER — Ambulatory Visit (INDEPENDENT_AMBULATORY_CARE_PROVIDER_SITE_OTHER): Payer: Medicaid Other | Admitting: *Deleted

## 2016-05-28 DIAGNOSIS — Z3042 Encounter for surveillance of injectable contraceptive: Secondary | ICD-10-CM

## 2016-05-28 DIAGNOSIS — Z3049 Encounter for surveillance of other contraceptives: Secondary | ICD-10-CM | POA: Diagnosis present

## 2016-05-28 LAB — POCT URINE PREGNANCY: PREG TEST UR: NEGATIVE

## 2016-05-28 MED ORDER — MEDROXYPROGESTERONE ACETATE 150 MG/ML IM SUSY
150.0000 mg | PREFILLED_SYRINGE | Freq: Once | INTRAMUSCULAR | Status: AC
  Administered 2016-05-28: 150 mg via INTRAMUSCULAR

## 2016-05-28 NOTE — Progress Notes (Signed)
Patient 4 days late for depo. Negative pregnancy test obtained, depo given in right upper outer quadrant. Patient without complaints, site unremarkable. Next depo due August 3 - August 17, reminder card given.

## 2016-08-30 ENCOUNTER — Ambulatory Visit: Payer: Medicaid Other

## 2016-09-30 ENCOUNTER — Ambulatory Visit (INDEPENDENT_AMBULATORY_CARE_PROVIDER_SITE_OTHER): Payer: Self-pay | Admitting: Internal Medicine

## 2016-09-30 ENCOUNTER — Encounter: Payer: Self-pay | Admitting: Internal Medicine

## 2016-09-30 VITALS — BP 102/60 | HR 63 | Temp 98.1°F | Wt 187.0 lb

## 2016-09-30 DIAGNOSIS — Z3042 Encounter for surveillance of injectable contraceptive: Secondary | ICD-10-CM

## 2016-09-30 DIAGNOSIS — R81 Glycosuria: Secondary | ICD-10-CM

## 2016-09-30 DIAGNOSIS — R8290 Unspecified abnormal findings in urine: Secondary | ICD-10-CM

## 2016-09-30 DIAGNOSIS — R7309 Other abnormal glucose: Secondary | ICD-10-CM

## 2016-09-30 DIAGNOSIS — Z3009 Encounter for other general counseling and advice on contraception: Secondary | ICD-10-CM

## 2016-09-30 DIAGNOSIS — Z3040 Encounter for surveillance of contraceptives, unspecified: Secondary | ICD-10-CM

## 2016-09-30 LAB — POCT URINALYSIS DIP (MANUAL ENTRY)
Bilirubin, UA: NEGATIVE
Ketones, POC UA: NEGATIVE mg/dL
Leukocytes, UA: NEGATIVE
Nitrite, UA: NEGATIVE
PH UA: 6.5 (ref 5.0–8.0)
Protein Ur, POC: NEGATIVE mg/dL
SPEC GRAV UA: 1.015 (ref 1.010–1.025)
UROBILINOGEN UA: 0.2 U/dL

## 2016-09-30 LAB — POCT URINE PREGNANCY: Preg Test, Ur: NEGATIVE

## 2016-09-30 LAB — POCT GLYCOSYLATED HEMOGLOBIN (HGB A1C): Hemoglobin A1C: 6.4

## 2016-09-30 LAB — POCT UA - MICROSCOPIC ONLY

## 2016-09-30 MED ORDER — MEDROXYPROGESTERONE ACETATE 150 MG/ML IM SUSP
150.0000 mg | Freq: Once | INTRAMUSCULAR | Status: AC
  Administered 2016-09-30: 150 mg via INTRAMUSCULAR

## 2016-09-30 NOTE — Assessment & Plan Note (Signed)
Discussed different birth control options. Patient would like to continue depo for now. - Urine pregnancy test negative - Depo given today - Patient did not have time for STD testing. Will return to clinic to have this done.

## 2016-09-30 NOTE — Progress Notes (Signed)
   [facility] Phone: [phone]  Subjective:  Amy [name] is a 31 year old female presenting to clinic to discuss birth control and urine issues.  Birth Control: She has been on depo since 02/2016. She has been tolerating the depo without any issues. No headaches, no weight gain. She would like to continue the depo. She is sexually active with one female partner over the last year.   Malodorous Urine: She also has been having malodorous and cloudy urine for the last 3 months. The urine does not have a fishy odor, but she does note that the urine smells like it did when she had bladder infections in the past. She has noticed that the cloudiness improves when she drinks a lot of water. She denies dysuria and urinary urgency. She does endorse urinary frequency. She denies any fevers. She endorses occasional right sided back pain.  ROS: See HPI for pertinent positives and negatives  Past Medical History- none  Family history reviewed for today's visit. No changes.  Social history- patient is a current smoker  Objective: BP 102/60   Pulse 63   Temp 98.1 F (36.7 C) (Oral)   Wt 187 lb (84.8 kg)   SpO2 98%   BMI 30.18 kg/m  Gen: NAD, alert, cooperative with exam GI: Soft, no suprapubic tenderness, no distension.  Assessment/Plan: Birth Control Counseling: Discussed different birth control options. Patient would like to continue depo for now. - Urine pregnancy test negative - Depo given today - Patient did not have time for STD testing. Will return to clinic to have this done.  Glucosuria: Patient endorsing malodorous and cloudy urine. Also with some urinary frequency. - UA negative for signs of infection, but positive for 500 glucose - Will check A1c   Willadean Carol, MD PGY-3

## 2016-09-30 NOTE — Assessment & Plan Note (Signed)
Patient endorsing malodorous and cloudy urine. Also with some urinary frequency. - UA negative for signs of infection, but positive for 500 glucose - Will check A1c

## 2016-09-30 NOTE — Patient Instructions (Signed)
It was so nice to see you!  You do not have a kidney infection. You do have glucose in your urine.  Please come back for the additional testing we talked about.  -Dr. Nancy Marus

## 2016-10-06 ENCOUNTER — Encounter: Payer: Self-pay | Admitting: Internal Medicine

## 2016-12-27 ENCOUNTER — Ambulatory Visit: Payer: Medicaid Other | Admitting: Internal Medicine

## 2017-01-05 ENCOUNTER — Ambulatory Visit: Payer: Medicaid Other | Admitting: Internal Medicine

## 2017-02-01 ENCOUNTER — Ambulatory Visit (INDEPENDENT_AMBULATORY_CARE_PROVIDER_SITE_OTHER): Payer: Medicaid Other | Admitting: *Deleted

## 2017-02-01 DIAGNOSIS — Z3202 Encounter for pregnancy test, result negative: Secondary | ICD-10-CM | POA: Diagnosis not present

## 2017-02-01 DIAGNOSIS — Z3042 Encounter for surveillance of injectable contraceptive: Secondary | ICD-10-CM | POA: Diagnosis not present

## 2017-02-01 LAB — POCT URINE PREGNANCY: PREG TEST UR: NEGATIVE

## 2017-02-01 MED ORDER — MEDROXYPROGESTERONE ACETATE 150 MG/ML IM SUSY
150.0000 mg | PREFILLED_SYRINGE | Freq: Once | INTRAMUSCULAR | Status: AC
  Administered 2017-02-01: 150 mg via INTRAMUSCULAR

## 2017-02-01 NOTE — Progress Notes (Signed)
   Patient presents for Depo Provera injection States feeling well Last injection received 09/30/2016  Patient is overdue for injection. Urine pregnancy negative  Depo Provera given IM RUOQ. Patient tolerated well.  Patient states it has been "2 day" since last had unprotected sex Patient aware to use second method of birth control such as condoms and spermicide for next 7 days Lab appt made for 02/15/2017 at 2 PM for second UPT  Next injection due April 9-23, 2019 Reminder card given  Pt declined flu vaccine. Fredderick SeveranceUCATTE, Jadis Pitter L, RN

## 2017-02-15 ENCOUNTER — Other Ambulatory Visit: Payer: Medicaid Other

## 2017-08-04 ENCOUNTER — Other Ambulatory Visit: Payer: Self-pay

## 2017-08-04 ENCOUNTER — Ambulatory Visit (INDEPENDENT_AMBULATORY_CARE_PROVIDER_SITE_OTHER): Payer: Self-pay | Admitting: Family Medicine

## 2017-08-04 ENCOUNTER — Other Ambulatory Visit (HOSPITAL_COMMUNITY)
Admission: RE | Admit: 2017-08-04 | Discharge: 2017-08-04 | Disposition: A | Discharge status: Home / Self Care | Payer: Medicaid Other | Source: Ambulatory Visit | Attending: Family Medicine | Admitting: Family Medicine

## 2017-08-04 VITALS — BP 118/72 | HR 89 | Temp 99.4°F | Ht 66.0 in | Wt 191.0 lb

## 2017-08-04 DIAGNOSIS — Z113 Encounter for screening for infections with a predominantly sexual mode of transmission: Secondary | ICD-10-CM | POA: Diagnosis present

## 2017-08-04 DIAGNOSIS — Z124 Encounter for screening for malignant neoplasm of cervix: Secondary | ICD-10-CM | POA: Diagnosis not present

## 2017-08-04 DIAGNOSIS — N912 Amenorrhea, unspecified: Secondary | ICD-10-CM

## 2017-08-04 LAB — POCT WET PREP (WET MOUNT)
CLUE CELLS WET PREP WHIFF POC: POSITIVE
Trichomonas Wet Prep HPF POC: ABSENT

## 2017-08-04 LAB — POCT URINE PREGNANCY: Preg Test, Ur: NEGATIVE

## 2017-08-04 NOTE — Patient Instructions (Signed)
It was great to meet you today! Thank you for letting me participate in your care!  Today, we discussed your recent concerns and needs to be checked for any STDs. I will call you with any abnormal labs results. If you need treatment I will send it to the pharmacy so you can pick it up without the need for returning for an appointment.  Be well, Jules Schickim Vito Beg, DO PGY-2, [facility]

## 2017-08-04 NOTE — Progress Notes (Signed)
     Subjective: Chief Complaint  Patient presents with  . std check     HPI: Amy [name] is a 32 y.o. presenting to clinic today to discuss the following:  STD Check Patient had unprotected intercourse with her boyfriend but they had spent some time apart. She requests to be checked for STDs. She has noticed some malodor during intercouse over the past few days. No bleeding, no pain, no abnormal discharge noted.  No fever, chills, abdominal pain, nausea, vomiting, diarrhea  Health Maintenance: pap smear     ROS noted in HPI.   Past Medical, Surgical, Social, and Family History Reviewed & Updated per EMR.   Pertinent Historical Findings include:   Social History   Tobacco Use  Smoking Status Never Smoker  Smokeless Tobacco Never Used  Tobacco Comment   4 cigs a day    Objective: BP 118/72   Pulse 89   Temp 99.4 F (37.4 C) (Oral)   Ht 5\' 6"  (1.676 m)   Wt 191 lb (86.6 kg)   SpO2 99%   BMI 30.83 kg/m  Vitals and nursing notes reviewed  Physical Exam Gen: Alert and Oriented x 3, NAD HEENT: Normocephalic, atraumatic, PERRLA, EOMI, non-erythematous pharyngeal mucosa, no exudates Neck: trachea midline, no thyroidmegaly, no LAD CV: RRR, no murmurs, normal S1, S2 split, +2 pulses dorsalis pedis bilaterally Resp: CTAB, no wheezing, rales, or rhonchi, comfortable work of breathing Abd: non-distended, non-tender, soft, +bs in all four quadrants GU: normal labia majora and vagina mucosa, no discharge, no lesions. Cervical os consistent with multiple pregnancies, some whitish discharge in vaginal canal and mild bleeding Ext: no clubbing, cyanosis, or edema Skin: warm, dry, intact, no rashes  No results found for this or any previous visit (from the past 72 hour(s)).  Assessment/Plan:  Screening for malignant neoplasm of cervix Pap smear routine, results pending.  Screen for STD (sexually transmitted disease) Given patient sexual history screened with HIV,  RPR, GC/CC and wet prep.  Wet prep consistent with BV. Sent in prescription for metronidazole for 7 days to pharmacy and patient has been notified. Other STD tests negative.    PATIENT EDUCATION PROVIDED: See AVS    Diagnosis and plan along with any newly prescribed medication(s) were discussed in detail with this patient today. The patient verbalized understanding and agreed with the plan. Patient advised if symptoms worsen return to clinic or ER.   Health Maintainance:   Orders Placed This Encounter  Procedures  . HIV antibody (with reflex)  . RPR  . POCT Wet Prep [facility]([place])    No orders of the defined types were placed in this encounter.  Jules Schickim Skylinn Vialpando, DO 08/04/2017, 9:21 AM PGY-2 [facility]

## 2017-08-05 ENCOUNTER — Telehealth: Payer: Self-pay | Admitting: *Deleted

## 2017-08-05 ENCOUNTER — Other Ambulatory Visit: Payer: Self-pay | Admitting: Family Medicine

## 2017-08-05 LAB — CERVICOVAGINAL ANCILLARY ONLY
Chlamydia: NEGATIVE
Neisseria Gonorrhea: NEGATIVE

## 2017-08-05 MED ORDER — METRONIDAZOLE 500 MG PO TABS: 500.0000 mg | ORAL_TABLET | Freq: Three times a day (TID) | ORAL | 0 refills | Status: DC

## 2017-08-05 NOTE — Telephone Encounter (Signed)
Pt called nurse line asking about test results from earlier this week. I did inform pt of positive BV and flagyl to her pharmacy. Pt would like the other lab results from visit. Please advise.

## 2017-08-05 NOTE — Telephone Encounter (Signed)
Arlyce HarmanLockamy, Timothy, DO at 08/05/2017 10:49 AM   Status: Signed    Sending Flagyl in for patient as she had clue cells and positive whiff test.      Attempted to call pt.  No answer at first number.  LMOVM asking her to return call.  Jasper Hanf, Maryjo RochesterJessica Dawn, CMA

## 2017-08-05 NOTE — Progress Notes (Signed)
Sending Flagyl in for patient as she had clue cells and positive whiff test.

## 2017-08-06 LAB — HIV ANTIBODY (ROUTINE TESTING W REFLEX): HIV Screen 4th Generation wRfx: NONREACTIVE

## 2017-08-06 LAB — RPR, QUANT+TP ABS (REFLEX)
Rapid Plasma Reagin, Quant: 1:2 {titer} — ABNORMAL HIGH
TREPONEMA PALLIDUM AB: NEGATIVE

## 2017-08-06 LAB — RPR: RPR: REACTIVE — AB

## 2017-08-08 LAB — CYTOLOGY - PAP: DIAGNOSIS: NEGATIVE

## 2017-08-09 DIAGNOSIS — N912 Amenorrhea, unspecified: Secondary | ICD-10-CM | POA: Insufficient documentation

## 2017-08-09 DIAGNOSIS — Z113 Encounter for screening for infections with a predominantly sexual mode of transmission: Secondary | ICD-10-CM | POA: Insufficient documentation

## 2017-08-09 DIAGNOSIS — Z124 Encounter for screening for malignant neoplasm of cervix: Secondary | ICD-10-CM | POA: Insufficient documentation

## 2017-08-09 NOTE — Assessment & Plan Note (Addendum)
Given patient sexual history screened with HIV, RPR, GC/CC and wet prep.  Wet prep consistent with BV. Sent in prescription for metronidazole for 7 days to pharmacy and patient has been notified. Other STD tests negative.

## 2017-08-09 NOTE — Assessment & Plan Note (Addendum)
Pap smear routine, results pending.

## 2017-09-22 ENCOUNTER — Encounter: Payer: Medicaid Other | Admitting: Family Medicine

## 2017-10-21 ENCOUNTER — Ambulatory Visit (INDEPENDENT_AMBULATORY_CARE_PROVIDER_SITE_OTHER): Payer: Self-pay | Admitting: Family Medicine

## 2017-10-21 ENCOUNTER — Other Ambulatory Visit (HOSPITAL_COMMUNITY)
Admission: RE | Admit: 2017-10-21 | Discharge: 2017-10-21 | Disposition: A | Discharge status: Home / Self Care | Payer: Medicaid Other | Source: Ambulatory Visit | Attending: Family Medicine | Admitting: Family Medicine

## 2017-10-21 VITALS — BP 100/75 | HR 73 | Temp 98.4°F | Wt 191.6 lb

## 2017-10-21 DIAGNOSIS — N898 Other specified noninflammatory disorders of vagina: Secondary | ICD-10-CM | POA: Diagnosis not present

## 2017-10-21 DIAGNOSIS — Z113 Encounter for screening for infections with a predominantly sexual mode of transmission: Secondary | ICD-10-CM

## 2017-10-21 LAB — POCT WET PREP (WET MOUNT)
Clue Cells Wet Prep Whiff POC: POSITIVE
Trichomonas Wet Prep HPF POC: ABSENT

## 2017-10-21 MED ORDER — METRONIDAZOLE 500 MG PO TABS: 500.0000 mg | ORAL_TABLET | Freq: Two times a day (BID) | ORAL | 0 refills | Status: AC

## 2017-10-21 NOTE — Patient Instructions (Signed)
It was nice meeting you today.  You were seen in clinic for STD testing - I will call you once I have the results of these and will send medications to your pharmacy if needed.  Please call clinic if you have any questions.  Be well, Freddrick March MD

## 2017-10-21 NOTE — Progress Notes (Signed)
   Subjective:   Patient ID: Amy [name]    DOB: [birth year], 32 y.o. female   MRN: [id number]  CC: vaginal odor   HPI: Amy [name] is a 32 y.o. female who presents to clinic today for the following issue.   Vaginal odor Patient states she was treated for BV in August.  3 weeks ago her and her partner lost the condom during sex.  Two days later it fell out of her vagina.  She is concerned that she may have another infection.  She noticed an odor that began last week.  Denies vaginal discharge.  No vaginal itching or irritation.  She is currently sexually active with 2 female partners.  As far she knows, no exposure to any STDs.  ROS: No fever, chills, nausea, vomiting.  No abdominal or pelvic pain.  No dysuria, frequency or urgency.  Social: She is a never smoker. Medications reviewed. Objective:   BP 100/75   Pulse 73   Temp 98.4 F (36.9 C)   Wt 191 lb 9.6 oz (86.9 kg)   SpO2 96%   BMI 30.93 kg/m  Vitals and nursing note reviewed.  General: 32 year old female, NAD Neck: supple CV: RRR no MRG  Lungs: CTAB, normal effort  Abdomen: soft, NTND, +bs  Pelvic exam: VULVA: normal appearing vulva with no masses, tenderness or lesions, VAGINA: normal appearing vagina with normal color and discharge, no lesions, CERVIX: normal appearing cervix without discharge or lesions. Skin: warm, dry, no rash  Assessment & Plan:   Screen for STD (sexually transmitted disease) No new known exposure.  Denies urinary symptoms and vaginal itching or irritation.  Desires STD testing today, declines RPR and HIV.  Will obtain wet prep and GC chlamydia today. -Wet prep positive for BV - Rx: Flagyl 500 mg BID x 7d -Safe sex counseling  Orders Placed This Encounter  Procedures  . POCT Wet Prep [facility])   Meds ordered this encounter  Medications  . metroNIDAZOLE (FLAGYL) 500 MG tablet    Sig: Take 1 tablet (500 mg total) by mouth 2 (two) times daily for 7 days.    Dispense:  14 tablet      Refill:  0   Freddrick March, MD [facility] PGY-3

## 2017-10-24 LAB — CERVICOVAGINAL ANCILLARY ONLY
Chlamydia: NEGATIVE
Neisseria Gonorrhea: NEGATIVE

## 2017-10-25 ENCOUNTER — Encounter: Payer: Self-pay | Admitting: Family Medicine

## 2017-10-25 NOTE — Assessment & Plan Note (Addendum)
No new known exposure.  Denies urinary symptoms and vaginal itching or irritation.  Desires STD testing today, declines RPR and HIV.  Will obtain wet prep and GC chlamydia today. -Wet prep positive for BV - Rx: Flagyl 500 mg BID x 7d -Safe sex counseling

## 2017-11-02 ENCOUNTER — Other Ambulatory Visit: Payer: Self-pay | Admitting: Family Medicine

## 2017-11-02 DIAGNOSIS — N898 Other specified noninflammatory disorders of vagina: Secondary | ICD-10-CM

## 2017-11-02 MED ORDER — METRONIDAZOLE 500 MG PO TABS: 500.0000 mg | ORAL_TABLET | Freq: Two times a day (BID) | ORAL | 0 refills | Status: DC

## 2018-03-27 ENCOUNTER — Ambulatory Visit: Payer: Medicaid Other | Admitting: Student in an Organized Health Care Education/Training Program

## 2018-03-29 ENCOUNTER — Telehealth: Payer: Self-pay | Admitting: Family Medicine

## 2018-03-29 ENCOUNTER — Other Ambulatory Visit: Payer: Self-pay | Admitting: Family Medicine

## 2018-03-29 MED ORDER — METRONIDAZOLE 500 MG PO TABS: 500.0000 mg | ORAL_TABLET | Freq: Two times a day (BID) | ORAL | 0 refills | Status: DC

## 2018-03-29 NOTE — Telephone Encounter (Signed)
Spoke with triage provider and he suggested that patient come in for a visit tomorrow for a wet prep.  Patient voiced understanding and appt made for tomorrow afternoon.  Jazmin Hartsell,CMA

## 2018-03-29 NOTE — Telephone Encounter (Signed)
Received message re: patient's appointment and followed up with telephone encounter to discuss.   Patient reports symptoms of fishy odor and discharge similar to previous episodes of BV.   She denies any thick white discharge, itching or irritation and does not feel this is a yeast infection.   She has been with the same partner for several years and is not concerned that this is an STD.   I have refilled her Flagyl for her and given return precautions if symptoms worsen or do not improve.   She was appreciative of alternative option and will follow-up with [place] if needed.  Freddrick March MD

## 2018-03-29 NOTE — Telephone Encounter (Signed)
Pt wanting to schedule an appt to get checked for BV. Told pt we are trying to minimize the amount of pt's coming into the office and the dr may decide to do a visit over the phone. Please call pt back to discuss.

## 2018-03-29 NOTE — Telephone Encounter (Signed)
Patient states that she is having another spell of BV.  She is having a fishy odor during intercourse and no abnormal discharge.  She has been experiencing it for the past month and is not having any burning or pain with urination.  She's not having any pain with intercourse.  Patient is still with her same partner and still using condoms with intercourse.   She usually takes flagyl in the oral dose and would like to get this refilled.  Will forward to Md to advise.  Eljay Lave,CMA

## 2018-03-30 ENCOUNTER — Ambulatory Visit: Payer: Medicaid Other

## 2018-03-30 ENCOUNTER — Ambulatory Visit: Payer: Medicaid Other | Admitting: Family Medicine

## 2018-05-10 ENCOUNTER — Telehealth: Payer: Self-pay | Admitting: *Deleted

## 2018-05-10 NOTE — Telephone Encounter (Signed)
Pt works for [employer] and was exposed to [employer].  She is asymptomatic, but will self quarantine and needs a letter for her job.  She needs this letter faxed to [phone] (placed in to be faxed pile).    Confirmed with Dr. Deirdre Priest that letter was ok to write without appt since pt is asymptomatic. Jone Baseman, CMA

## 2018-08-16 ENCOUNTER — Encounter: Payer: Self-pay | Admitting: Family Medicine

## 2018-08-16 ENCOUNTER — Telehealth (INDEPENDENT_AMBULATORY_CARE_PROVIDER_SITE_OTHER): Payer: Self-pay | Admitting: Family Medicine

## 2018-08-16 ENCOUNTER — Other Ambulatory Visit: Payer: Self-pay

## 2018-08-16 DIAGNOSIS — B9689 Other specified bacterial agents as the cause of diseases classified elsewhere: Secondary | ICD-10-CM

## 2018-08-16 DIAGNOSIS — N760 Acute vaginitis: Secondary | ICD-10-CM

## 2018-08-16 MED ORDER — METRONIDAZOLE 500 MG PO TABS: 500.0000 mg | ORAL_TABLET | Freq: Two times a day (BID) | ORAL | 0 refills | Status: DC

## 2018-08-16 NOTE — Assessment & Plan Note (Signed)
Patient experiencing vaginal odor especially during intercourse.  No other genitourinary symptoms or complaints.  No fevers.  Patient states she was treated with metronidazole in October and February with success.  Advised patient that I can prescribe her metronidazole but if this occurs again she will need to come into the office for further work-up. -7 days twice daily metronidazole

## 2018-08-16 NOTE — Progress Notes (Signed)
[place] Telemedicine Visit  Patient consented to have virtual visit. Method of visit: Telephone  Encounter participants: Patient: Amy [name] - located at home Provider: Benay Pike - located at [facility] Others (if applicable):   Chief Complaint: Vaginal odor  HPI: Patient states that she has noticed vaginal odor during intercourse.  She is not having any vaginal pain or abdominal pain.  No pruritus or discharge.  She states she had a wet prep done last year that showed bacterial vaginosis, and was then treated for another episode in February over the phone with metronidazole.  She is having sex with the same partner, they do not use protection.  ROS: per HPI  Pertinent PMHx: Bacterial vaginosis  Exam:  Respiratory: Speaking in full sentences  Assessment/Plan:  Bacterial vaginosis Patient experiencing vaginal odor especially during intercourse.  No other genitourinary symptoms or complaints.  No fevers.  Patient states she was treated with metronidazole in October and February with success.  Advised patient that I can prescribe her metronidazole but if this occurs again she will need to come into the office for further work-up. -7 days twice daily metronidazole    Time spent during visit with patient: 12 minutes

## 2018-09-24 ENCOUNTER — Encounter (HOSPITAL_COMMUNITY): Payer: Self-pay

## 2018-09-24 ENCOUNTER — Other Ambulatory Visit: Payer: Self-pay

## 2018-09-24 ENCOUNTER — Emergency Department (HOSPITAL_COMMUNITY)
Admission: EM | Admit: 2018-09-24 | Discharge: 2018-09-24 | Disposition: A | Discharge status: Home / Self Care | Payer: Medicaid Other | Attending: Emergency Medicine | Admitting: Emergency Medicine

## 2018-09-24 DIAGNOSIS — J029 Acute pharyngitis, unspecified: Secondary | ICD-10-CM

## 2018-09-24 DIAGNOSIS — F17210 Nicotine dependence, cigarettes, uncomplicated: Secondary | ICD-10-CM | POA: Insufficient documentation

## 2018-09-24 DIAGNOSIS — Z20828 Contact with and (suspected) exposure to other viral communicable diseases: Secondary | ICD-10-CM | POA: Insufficient documentation

## 2018-09-24 LAB — SARS CORONAVIRUS 2 BY RT PCR (HOSPITAL ORDER, PERFORMED IN ~~LOC~~ HOSPITAL LAB): SARS Coronavirus 2: NEGATIVE

## 2018-09-24 LAB — GROUP A STREP BY PCR: Group A Strep by PCR: NOT DETECTED

## 2018-09-24 MED ORDER — PENICILLIN V POTASSIUM 500 MG PO TABS: 500.0000 mg | ORAL_TABLET | Freq: Three times a day (TID) | ORAL | 0 refills | Status: AC

## 2018-09-24 MED ORDER — DEXAMETHASONE 4 MG PO TABS
10.0000 mg | ORAL_TABLET | Freq: Once | ORAL | Status: AC
  Administered 2018-09-24: 10 mg via ORAL
  Filled 2018-09-24: qty 2

## 2018-09-24 NOTE — ED Provider Notes (Signed)
[facility]-EMERGENCY DEPT Provider Note   CSN: [id number] Arrival date & time: 09/24/18  1314     History   Chief Complaint Chief Complaint  Patient presents with  . Sore Throat    HPI Amy [name] is a 33 y.o. female who presents to the ED with c/o sore throat that started last night. Patient reports white spots on her tonsils and pain with swallowing. Patient denies cough or shortness of breath.      HPI  Past Medical History:  Diagnosis Date  . Chlamydia 2008  . Medical history non-contributory     Patient Active Problem List   Diagnosis Date Noted  . Bacterial vaginosis 08/16/2018  . Screening for malignant neoplasm of cervix 08/09/2017  . Screen for STD (sexually transmitted disease) 08/09/2017  . Amenorrhea 08/09/2017  . Glucosuria 09/30/2016  . Active smoker 06/03/2011  . Birth control counseling 06/03/2011    Past Surgical History:  Procedure Laterality Date  . LIPOSUCTION    . NO PAST SURGERIES    . tummy tuck       OB History    Gravida  8   Para  4   Term  4   Preterm  0   AB  4   Living  4     SAB  0   TAB  4   Ectopic  0   Multiple  0   Live Births               Home Medications    Prior to Admission medications   Medication Sig Start Date End Date Taking? Authorizing Provider  medroxyPROGESTERone (DEPO-PROVERA) 150 MG/ML injection Inject 150 mg into the muscle every 3 (three) months.    [provider]  metroNIDAZOLE (FLAGYL) 500 MG tablet Take 1 tablet (500 mg total) by mouth 2 (two) times daily. 08/16/18   Sandre Kittylson, Daniel K, MD  penicillin v potassium (VEETID) 500 MG tablet Take 1 tablet (500 mg total) by mouth 3 (three) times daily for 10 days. 09/24/18 10/04/18  Janne NapoleonNeese, Mayank Teuscher M, NP    Family History Family History  Problem Relation Age of Onset  . Cancer Maternal Grandfather 4761  . Cancer Maternal Grandmother        breast and cervical  . Diabetes Neg Hx   . Hyperlipidemia Neg Hx    . Hypertension Neg Hx   . Other Neg Hx     Social History Social History   Tobacco Use  . Smoking status: Current Every Day Smoker    Years: 3.00    Types: Cigars  . Smokeless tobacco: Never Used  Substance Use Topics  . Alcohol use: Yes    Alcohol/week: 1.0 standard drinks    Types: 1 Cans of beer per week  . Drug use: No     Allergies   Patient has no known allergies.   Review of Systems Review of Systems  Constitutional: Negative for chills and fever.  HENT: Positive for sore throat.   Eyes: Negative for discharge and redness.  Respiratory: Negative for cough and shortness of breath.   Cardiovascular: Negative for chest pain.  Gastrointestinal: Negative for abdominal pain, nausea and vomiting.  Genitourinary: Negative for dysuria.  Musculoskeletal: Negative for back pain.  Skin: Negative for rash.  Psychiatric/Behavioral: Negative for confusion.     Physical Exam Updated Vital Signs BP (!) 133/91 (BP Location: Left Arm)   Pulse 97   Temp 99.3 F (37.4 C) (Oral)  Resp 16   Ht 5\' 6"  (1.676 m)   Wt 83.9 kg   LMP 07/20/2018   SpO2 98%   BMI 29.86 kg/m   Physical Exam Vitals signs and nursing note reviewed.  Constitutional:      Appearance: She is well-developed.  HENT:     Head: Normocephalic.     Right Ear: Tympanic membrane normal.     Left Ear: Tympanic membrane normal.     Mouth/Throat:     Mouth: Mucous membranes are moist.     Pharynx: Uvula midline. Oropharyngeal exudate and posterior oropharyngeal erythema present.     Tonsils: Tonsillar exudate present. No tonsillar abscesses. 1+ on the right. 1+ on the left.     Comments: Anterior cervical lymph nodes enlarged. No difficulty swallowing.  Eyes:     Extraocular Movements:     Right eye: Normal extraocular motion.     Left eye: Normal extraocular motion.     Conjunctiva/sclera: Conjunctivae normal.  Neck:     Musculoskeletal: Normal range of motion and neck supple.  Cardiovascular:      Rate and Rhythm: Normal rate and regular rhythm.  Pulmonary:     Effort: Pulmonary effort is normal.  Abdominal:     Palpations: Abdomen is soft.     Tenderness: There is no abdominal tenderness.  Musculoskeletal: Normal range of motion.  Skin:    General: Skin is warm and dry.  Neurological:     Mental Status: She is alert and oriented to person, place, and time.  Psychiatric:        Mood and Affect: Mood normal.      ED Treatments / Results  Labs (all labs ordered are listed, but only abnormal results are displayed) Labs Reviewed  GROUP A STREP BY PCR  SARS CORONAVIRUS 2 (HOSPITAL ORDER, [facility])    Radiology No results found.  Procedures Procedures (including critical care time)  Medications Ordered in ED Medications  dexamethasone (DECADRON) tablet 10 mg (has no administration in time range)     Initial Impression / Assessment and Plan / ED Course  I have reviewed the triage vital signs and the nursing notes. Pt afebrile with tonsillar exudate, cervical lymphadenopathy, & dysphagia; diagnosis of bacterial pharyngitis. Treated in the ED with steroids. PCN prescribed. Discussed with the patient culture pending but will treat clinical presentation. Presentation non concerning for PTA or RPA. No trismus or uvula deviation. Specific return precautions discussed. Pt able to drink water in ED without difficulty with intact air way. Recommended PCP follow up.   Final Clinical Impressions(s) / ED Diagnoses   Final diagnoses:  Exudative pharyngitis    ED Discharge Orders         Ordered    penicillin v potassium (VEETID) 500 MG tablet  3 times daily     09/24/18 1745           Debroah Baller [place], NP 09/24/18 1800    Wyvonnia Dusky, MD 09/24/18 2257

## 2018-09-24 NOTE — ED Triage Notes (Signed)
Patient states she began having a sore throat after eating seafood last night . Patient has redness and white on her tonsils. Paitent denies any problems swallowing or breathing.

## 2018-09-24 NOTE — Discharge Instructions (Addendum)
Take tylenol and ibuprofen as needed for pain. We are treating you for bacterial infection of the throat. Take the antibiotic as directed. Return if symptoms worsen.

## 2019-01-15 ENCOUNTER — Telehealth (INDEPENDENT_AMBULATORY_CARE_PROVIDER_SITE_OTHER): Payer: Self-pay | Admitting: Family Medicine

## 2019-01-15 ENCOUNTER — Encounter: Payer: Self-pay | Admitting: Family Medicine

## 2019-01-15 ENCOUNTER — Other Ambulatory Visit: Payer: Self-pay

## 2019-01-15 ENCOUNTER — Ambulatory Visit: Payer: Medicaid Other

## 2019-01-15 DIAGNOSIS — N760 Acute vaginitis: Secondary | ICD-10-CM

## 2019-01-15 DIAGNOSIS — B9689 Other specified bacterial agents as the cause of diseases classified elsewhere: Secondary | ICD-10-CM

## 2019-01-15 MED ORDER — METRONIDAZOLE 0.75 % VA GEL: 1.0000 | Freq: Two times a day (BID) | VAGINAL | 3 refills | Status: DC

## 2019-01-15 MED ORDER — METRONIDAZOLE 500 MG PO TABS: 500.0000 mg | ORAL_TABLET | Freq: Three times a day (TID) | ORAL | 0 refills | Status: DC

## 2019-01-15 NOTE — Progress Notes (Signed)
[facility] Telemedicine Visit  Patient consented to have virtual visit. Method of visit: Telephone  Encounter participants: Patient: Amy [name] - located at home address  Provider: Nicki Guadalajara - located at provider's home  Others (if applicable): none  Chief Complaint: BV/bladder infection   HPI:  Patient states she has been experiencing reoccurring symptoms similar to when she has been diagnosed with bacterial vaginosis in the past. She reports having 2-3 episodes over the course of the last year.    The patient reports taking all of the prescribed medication, yet still symptoms continue to return.   Her main symptoms for now include a vaginal odor during sex (described as "fishy"), patient denies use of condoms, denies vaginal discharge, denies vaginal bleeding and denies pain with intercourse.   She was last treated with antibiotics(PCN) in Sept of 2020.  Denies using any new vaginal soaps or creams  Previous Concern for UTI  Patient states she has never been diagnosed with a UTI before and adds taht she made this appointment a while ago. She denies any dysuria or increased urinary frequency. Was previously having lower abdominal pressure but now attributes this to her menses at the time.  ROS: per HPI  Pertinent PMHx: Bacterial Vaginosis  Exam:  Respiratory: patient is speaking in full and clear sentences without signs of respiratory distress   Assessment/Plan:  Bacterial vaginosis Patient is likely experiencing repeat BV due to unsuccessful eradication from previous diagnosis. It is also possible that patient has UTI but less likely given patient has no dysuria or increased urinary frequency. Vaginal odor could also be secondary to mixed genital flora from unprotected sex.  -prescribe metronidazole PO and vaginal gel      Time spent during visit with patient: 20 minutes

## 2019-01-17 NOTE — Assessment & Plan Note (Signed)
Patient is likely experiencing repeat BV due to unsuccessful eradication from previous diagnosis. It is also possible that patient has UTI but less likely given patient has no dysuria or increased urinary frequency. Vaginal odor could also be secondary to mixed genital flora from unprotected sex.  -prescribe metronidazole PO and vaginal gel

## 2019-02-22 ENCOUNTER — Ambulatory Visit: Payer: Medicaid Other | Attending: Internal Medicine

## 2019-02-22 DIAGNOSIS — Z20822 Contact with and (suspected) exposure to covid-19: Secondary | ICD-10-CM

## 2019-02-24 LAB — NOVEL CORONAVIRUS, NAA: SARS-CoV-2, NAA: DETECTED — AB

## 2019-04-20 ENCOUNTER — Emergency Department (HOSPITAL_COMMUNITY)
Admission: EM | Admit: 2019-04-20 | Discharge: 2019-04-20 | Disposition: A | Discharge status: Home / Self Care | Payer: Self-pay | Attending: Emergency Medicine | Admitting: Emergency Medicine

## 2019-04-20 ENCOUNTER — Encounter (HOSPITAL_COMMUNITY): Payer: Self-pay | Admitting: *Deleted

## 2019-04-20 ENCOUNTER — Other Ambulatory Visit: Payer: Self-pay

## 2019-04-20 DIAGNOSIS — F17210 Nicotine dependence, cigarettes, uncomplicated: Secondary | ICD-10-CM | POA: Insufficient documentation

## 2019-04-20 DIAGNOSIS — M7918 Myalgia, other site: Secondary | ICD-10-CM | POA: Insufficient documentation

## 2019-04-20 DIAGNOSIS — Z79899 Other long term (current) drug therapy: Secondary | ICD-10-CM | POA: Insufficient documentation

## 2019-04-20 DIAGNOSIS — Y9289 Other specified places as the place of occurrence of the external cause: Secondary | ICD-10-CM | POA: Insufficient documentation

## 2019-04-20 DIAGNOSIS — Y999 Unspecified external cause status: Secondary | ICD-10-CM | POA: Insufficient documentation

## 2019-04-20 DIAGNOSIS — Y9389 Activity, other specified: Secondary | ICD-10-CM | POA: Insufficient documentation

## 2019-04-20 MED ORDER — NAPROXEN 500 MG PO TABS: 500.0000 mg | ORAL_TABLET | Freq: Two times a day (BID) | ORAL | 0 refills | Status: AC

## 2019-04-20 MED ORDER — METHOCARBAMOL 750 MG PO TABS: 750.0000 mg | ORAL_TABLET | Freq: Every evening | ORAL | 0 refills | Status: AC | PRN
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
## 2019-04-20 NOTE — Discharge Instructions (Addendum)
You may alternate taking Tylenol and Naproxen as needed for pain control. You may take Naproxen twice daily as directed on your discharge paperwork and you may take  [phone] mg of Tylenol every 6 hours. Do not exceed 4000 mg of Tylenol daily as this can lead to liver damage. Also, make sure to take Naproxen with meals as it can cause an upset stomach. Do not take other NSAIDs while taking Naproxen such as (Aleve, Ibuprofen, Aspirin, Celebrex, etc) and do not take more than the prescribed dose as this can lead to ulcers and bleeding in your GI tract. You may use warm and cold compresses to help with your symptoms.   You were given a prescription for Robaxin which is a muscle relaxer.  You should not drive, work, or operate machinery while taking this medication as it can make you very drowsy.    Do not take the naproxen or Robaxin if there is any chance of pregnancy.  Please follow up with your primary doctor within the next 7-10 days for re-evaluation and further treatment of your symptoms.   Please return to the ER sooner if you have any new or worsening symptoms.
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
## 2019-06-20 ENCOUNTER — Other Ambulatory Visit: Payer: Medicaid Other

## 2019-06-20 ENCOUNTER — Ambulatory Visit: Payer: Medicaid Other | Attending: Internal Medicine

## 2019-06-20 DIAGNOSIS — Z20822 Contact with and (suspected) exposure to covid-19: Secondary | ICD-10-CM

## 2019-06-21 ENCOUNTER — Encounter (HOSPITAL_COMMUNITY): Payer: Self-pay

## 2019-06-21 ENCOUNTER — Other Ambulatory Visit: Payer: Self-pay

## 2019-06-21 ENCOUNTER — Ambulatory Visit (HOSPITAL_COMMUNITY)
Admission: EM | Admit: 2019-06-21 | Discharge: 2019-06-21 | Disposition: A | Discharge status: Home / Self Care | Payer: Medicaid Other | Attending: Urgent Care | Admitting: Urgent Care

## 2019-06-21 DIAGNOSIS — N6452 Nipple discharge: Secondary | ICD-10-CM

## 2019-06-21 DIAGNOSIS — N644 Mastodynia: Secondary | ICD-10-CM

## 2019-06-21 DIAGNOSIS — N61 Mastitis without abscess: Secondary | ICD-10-CM

## 2019-06-21 LAB — SARS-COV-2, NAA 2 DAY TAT

## 2019-06-21 LAB — NOVEL CORONAVIRUS, NAA: SARS-CoV-2, NAA: NOT DETECTED

## 2019-06-21 MED ORDER — NAPROXEN 500 MG PO TABS: 500.0000 mg | ORAL_TABLET | Freq: Two times a day (BID) | ORAL | 0 refills | Status: AC

## 2019-06-21 MED ORDER — DOXYCYCLINE HYCLATE 100 MG PO CAPS: 100.0000 mg | ORAL_CAPSULE | Freq: Two times a day (BID) | ORAL | 0 refills | Status: DC

## 2019-06-21 NOTE — ED Triage Notes (Signed)
Pt is here with  Right nipple pain that started a week ago after getting this done a year ago. Pt states within the last week it has leaking fluid and is severely painful.

## 2019-06-21 NOTE — ED Provider Notes (Signed)
[facility]   MRN: [id number] DOB: [birth year]  Subjective:   Amy [name] is a 33 y.o. female presenting for 1 year history of persistent bilateral nipple discharge from nipple piercings.  It is always been intermittent and mild.  Patient has practice good hygiene.  Unfortunately, in the past week the right nipple has become painful and has started draining more.  She does not want to take the nipple ring out indefinitely.  Denies fever, nausea, vomiting, chest pain, red streaks.  No current facility-administered medications for this encounter.  Current Outpatient Medications:  .  metroNIDAZOLE (METROGEL) 0.75 % vaginal gel, Place 1 Applicatorful vaginally 2 (two) times daily. Please apply to vagina twice daily, two days per week for 3 months after completing oral pill course., Disp: 70 g, Rfl: 3   No Known Allergies  Past Medical History:  Diagnosis Date  . Chlamydia 2008  . Medical history non-contributory      Past Surgical History:  Procedure Laterality Date  . LIPOSUCTION    . NO PAST SURGERIES    . tummy tuck      Family History  Problem Relation Age of Onset  . Cancer Maternal Grandfather 2  . Breast cancer Mother   . Healthy Father   . Cancer Maternal Grandmother        breast and cervical  . Diabetes Neg Hx   . Hyperlipidemia Neg Hx   . Hypertension Neg Hx   . Other Neg Hx     Social History   Tobacco Use  . Smoking status: Current Every Day Smoker    Years: 3.00    Types: Cigars  . Smokeless tobacco: Never Used  Vaping Use  . Vaping Use: Never used  Substance Use Topics  . Alcohol use: Yes    Alcohol/week: 1.0 standard drink    Types: 1 Cans of beer per week  . Drug use: No    ROS   Objective:   Vitals: BP (!) 122/54 (BP Location: Right Arm)   Pulse 64   Temp 98.8 F (37.1 C) (Oral)   Resp 18   Wt 190 lb (86.2 kg)   LMP 05/16/2019   SpO2 98%   BMI 30.67 kg/m   Physical Exam Exam conducted with a chaperone present  Psychiatrist).  Constitutional:      General: She is not in acute distress.    Appearance: Normal appearance. She is well-developed. She is not ill-appearing, toxic-appearing or diaphoretic.  HENT:     Head: Normocephalic and atraumatic.     Nose: Nose normal.     Mouth/Throat:     Mouth: Mucous membranes are moist.     Pharynx: Oropharynx is clear.  Eyes:     General: No scleral icterus.       Right eye: No discharge.        Left eye: No discharge.     Extraocular Movements: Extraocular movements intact.     Pupils: Pupils are equal, round, and reactive to light.  Cardiovascular:     Rate and Rhythm: Normal rate.  Pulmonary:     Effort: Pulmonary effort is normal.  Chest:     Breasts:        Right: Tenderness (about the nipple piercing with slight discharge) present. No swelling, bleeding, inverted nipple, mass, nipple discharge or skin change.        Left: No swelling, bleeding, inverted nipple, mass, nipple discharge, skin change or tenderness.  Skin:    General:  Skin is warm and dry.  Neurological:     General: No focal deficit present.     Mental Status: She is alert and oriented to person, place, and time.  Psychiatric:        Mood and Affect: Mood normal.        Behavior: Behavior normal.        Thought Content: Thought content normal.        Judgment: Judgment normal.      Assessment and Plan :   PDMP not reviewed this encounter.  1. Nipple pain   2. Nipple discharge   3. Nipple infection     Patient denies concerns for pregnancy.  Start doxycycline, counseled on wound care.  Use Naprosyn for pain and inflammation. Counseled patient on potential for adverse effects with medications prescribed/recommended today, ER and return-to-clinic precautions discussed, patient verbalized understanding.    Jaynee Eagles, PA-C 06/21/19 1358

## 2019-09-07 ENCOUNTER — Ambulatory Visit (INDEPENDENT_AMBULATORY_CARE_PROVIDER_SITE_OTHER): Payer: Self-pay | Admitting: Family Medicine

## 2019-09-07 ENCOUNTER — Other Ambulatory Visit (HOSPITAL_COMMUNITY)
Admission: RE | Admit: 2019-09-07 | Discharge: 2019-09-07 | Disposition: A | Discharge status: Home / Self Care | Payer: Medicaid Other | Source: Ambulatory Visit | Attending: Family Medicine | Admitting: Family Medicine

## 2019-09-07 ENCOUNTER — Other Ambulatory Visit: Payer: Self-pay

## 2019-09-07 VITALS — BP 118/64 | HR 80 | Wt 189.0 lb

## 2019-09-07 DIAGNOSIS — B9689 Other specified bacterial agents as the cause of diseases classified elsewhere: Secondary | ICD-10-CM

## 2019-09-07 DIAGNOSIS — N898 Other specified noninflammatory disorders of vagina: Secondary | ICD-10-CM | POA: Diagnosis not present

## 2019-09-07 DIAGNOSIS — Z3009 Encounter for other general counseling and advice on contraception: Secondary | ICD-10-CM

## 2019-09-07 DIAGNOSIS — N760 Acute vaginitis: Secondary | ICD-10-CM

## 2019-09-07 DIAGNOSIS — Z113 Encounter for screening for infections with a predominantly sexual mode of transmission: Secondary | ICD-10-CM

## 2019-09-07 DIAGNOSIS — Z114 Encounter for screening for human immunodeficiency virus [HIV]: Secondary | ICD-10-CM

## 2019-09-07 LAB — POCT WET PREP (WET MOUNT)
Clue Cells Wet Prep Whiff POC: POSITIVE
Trichomonas Wet Prep HPF POC: ABSENT

## 2019-09-07 MED ORDER — METRONIDAZOLE 500 MG PO TABS: 500.0000 mg | ORAL_TABLET | Freq: Two times a day (BID) | ORAL | 0 refills | Status: AC

## 2019-09-07 MED ORDER — METRONIDAZOLE 500 MG PO TABS: 500.0000 mg | ORAL_TABLET | Freq: Three times a day (TID) | ORAL | 0 refills | Status: DC

## 2019-09-07 NOTE — Progress Notes (Signed)
    SUBJECTIVE:   CHIEF COMPLAINT / HPI: vaginal odor, discharge  34 yo female patient with history of recurrent BV, here for testing for STIs as well. She has the same partner, use condoms for [facility], not interested in other methods at this time. Patient started having discomfort, white discharge, and odor c/w previous BV and wishes to be tested today. Last intercourse was 8/22, LMP 8/10. Patient used metrogel last time, believes it caused a yeast infection afterward. If she has BV, she would prefer flagyl pills instead. She has started using a vaginal probiotic, unsure of CFUs. Also asks opinion about douching, she has not done it, but wonders if it will help. She denies dysuria, burning pain, fever, chills, etc.  PERTINENT  PMH / PSH: recurrent BV  OBJECTIVE:   BP 118/64   Pulse 80   Wt 189 lb (85.7 kg)   LMP 08/16/2019 (Exact Date)   BMI 30.51 kg/m   Physical Exam Vitals and nursing note reviewed.  Constitutional:      General: She is not in acute distress.    Appearance: Normal appearance. She is obese. She is not ill-appearing, toxic-appearing or diaphoretic.  Genitourinary:    Comments: PELVIC:  Normal appearing external female genitalia, normal vaginal epithelium, small amount of white, thin discharge. Normal appearing cervix.   Skin:    General: Skin is warm and dry.  Neurological:     Mental Status: She is alert. Mental status is at baseline.  Psychiatric:        Mood and Affect: Mood normal.        Behavior: Behavior normal.    ASSESSMENT/PLAN:   No problem-specific Assessment & Plan notes found for this encounter.     Shirlean Mylar, MD [facility]

## 2019-09-07 NOTE — Assessment & Plan Note (Addendum)
Recurrent BV present on wet prep.  - flagyl 500 mg BID x7 days - consider boric acid suppositories after periods/sex - consider probiotic with high (in billions) CFU count - counseled against douching as it is likely to increase pH and more likely to cause recurrent infections

## 2019-09-07 NOTE — Patient Instructions (Signed)
It was a pleasure to meet you today!  1. We did some lab work and I will let you know the results. I will call in medication if indicated and let you know.  2. For BV- you may use probiotics, I would look for over a billion colony forming units (CFUs) on packaging. Also consider JarroDophilus products.  3. You may also try boric acid suppositories after a period or after sex or when you have symptoms. You can get these over the counter, or from [place]. Be sure no pets or children get into these pills though.  Feel better!  Dr. Leary Roca

## 2019-09-07 NOTE — Assessment & Plan Note (Signed)
HIV, RPR, GC/chlamydia sent today.

## 2019-09-07 NOTE — Assessment & Plan Note (Signed)
Discussed possibility of pregnancy and reliability of barrier methods. No change at this time.

## 2019-09-09 LAB — RPR, QUANT+TP ABS (REFLEX)
Rapid Plasma Reagin, Quant: 1:4 {titer} — ABNORMAL HIGH
T Pallidum Abs: NONREACTIVE

## 2019-09-09 LAB — HIV ANTIBODY (ROUTINE TESTING W REFLEX): HIV Screen 4th Generation wRfx: NONREACTIVE

## 2019-09-09 LAB — RPR: RPR Ser Ql: REACTIVE — AB

## 2019-09-10 LAB — CERVICOVAGINAL ANCILLARY ONLY
Chlamydia: NEGATIVE
Comment: NEGATIVE
Comment: NORMAL
Neisseria Gonorrhea: NEGATIVE

## 2021-06-16 ENCOUNTER — Encounter: Payer: Self-pay | Admitting: *Deleted
